# Patient Record
Sex: Female | Born: 1965 | Race: Black or African American | Hispanic: No | Smoking: Never smoker
Health system: Southern US, Community
[De-identification: ages and names within clinical notes are randomized; demographics above are authoritative.]

## PROBLEM LIST (undated history)

## (undated) DIAGNOSIS — M810 Age-related osteoporosis without current pathological fracture: Secondary | ICD-10-CM

## (undated) DIAGNOSIS — I10 Essential (primary) hypertension: Secondary | ICD-10-CM

## (undated) HISTORY — PX: OOPHORECTOMY: SHX86

## (undated) HISTORY — DX: Essential (primary) hypertension: I10

## (undated) HISTORY — DX: Morbid (severe) obesity due to excess calories: E66.01

## (undated) HISTORY — DX: Age-related osteoporosis without current pathological fracture: M81.0

---

## 1999-10-04 ENCOUNTER — Ambulatory Visit (HOSPITAL_COMMUNITY): Admission: RE | Admit: 1999-10-04 | Discharge: 1999-10-04 | Payer: Self-pay | Admitting: *Deleted

## 1999-10-04 ENCOUNTER — Encounter: Payer: Self-pay | Admitting: *Deleted

## 1999-10-11 ENCOUNTER — Encounter: Payer: Self-pay | Admitting: *Deleted

## 1999-10-11 ENCOUNTER — Ambulatory Visit (HOSPITAL_COMMUNITY): Admission: RE | Admit: 1999-10-11 | Discharge: 1999-10-11 | Payer: Self-pay | Admitting: *Deleted

## 2001-08-03 ENCOUNTER — Encounter: Payer: Self-pay | Admitting: Obstetrics and Gynecology

## 2001-08-03 ENCOUNTER — Ambulatory Visit (HOSPITAL_COMMUNITY): Admission: RE | Admit: 2001-08-03 | Discharge: 2001-08-03 | Payer: Self-pay | Admitting: Obstetrics and Gynecology

## 2001-08-18 ENCOUNTER — Ambulatory Visit (HOSPITAL_COMMUNITY): Admission: RE | Admit: 2001-08-18 | Discharge: 2001-08-18 | Payer: Self-pay | Admitting: Obstetrics and Gynecology

## 2001-08-18 ENCOUNTER — Encounter: Payer: Self-pay | Admitting: Obstetrics and Gynecology

## 2001-09-15 ENCOUNTER — Ambulatory Visit (HOSPITAL_COMMUNITY): Admission: RE | Admit: 2001-09-15 | Discharge: 2001-09-15 | Payer: Self-pay | Admitting: Obstetrics and Gynecology

## 2001-09-15 ENCOUNTER — Encounter: Payer: Self-pay | Admitting: Obstetrics and Gynecology

## 2002-01-11 ENCOUNTER — Inpatient Hospital Stay (HOSPITAL_COMMUNITY): Admission: AD | Admit: 2002-01-11 | Discharge: 2002-01-14 | Payer: Self-pay | Admitting: Obstetrics and Gynecology

## 2002-01-15 ENCOUNTER — Encounter: Admission: RE | Admit: 2002-01-15 | Discharge: 2002-02-14 | Payer: Self-pay | Admitting: Obstetrics and Gynecology

## 2002-02-15 ENCOUNTER — Encounter: Admission: RE | Admit: 2002-02-15 | Discharge: 2002-03-17 | Payer: Self-pay | Admitting: Obstetrics and Gynecology

## 2002-09-20 ENCOUNTER — Other Ambulatory Visit: Admission: RE | Admit: 2002-09-20 | Discharge: 2002-09-20 | Payer: Self-pay | Admitting: Obstetrics and Gynecology

## 2003-11-15 ENCOUNTER — Other Ambulatory Visit: Admission: RE | Admit: 2003-11-15 | Discharge: 2003-11-15 | Payer: Self-pay | Admitting: Obstetrics and Gynecology

## 2007-09-28 ENCOUNTER — Encounter: Admission: RE | Admit: 2007-09-28 | Discharge: 2007-09-28 | Payer: Self-pay | Admitting: Obstetrics and Gynecology

## 2007-11-08 ENCOUNTER — Encounter (INDEPENDENT_AMBULATORY_CARE_PROVIDER_SITE_OTHER): Payer: Self-pay | Admitting: Obstetrics and Gynecology

## 2007-11-08 ENCOUNTER — Ambulatory Visit (HOSPITAL_COMMUNITY): Admission: RE | Admit: 2007-11-08 | Discharge: 2007-11-08 | Payer: Self-pay | Admitting: Obstetrics and Gynecology

## 2009-08-09 ENCOUNTER — Emergency Department (HOSPITAL_COMMUNITY): Admission: EM | Admit: 2009-08-09 | Discharge: 2009-08-09 | Payer: Self-pay | Admitting: Emergency Medicine

## 2010-07-11 IMAGING — CT CT ABD-PELV W/ CM
2 of 6 series · 17 of 46 positions shown, 19 images · IV contrast (agent unspecified)
Comparison: None

CLINICAL DATA: Right lower quadrant abdominal pain.

CT ABDOMEN AND PELVIS WITH CONTRAST
TECHNIQUE: Multidetector CT imaging of the abdomen and pelvis was
performed following the standard protocol during bolus
administration of intravenous contrast.
Contrast: 100 ml Vmnipaque-3EE

[Series 4: recon 3: routine abdomen · axial · 0.74mm/px · z∈[-474,-86]mm · 14 of 342 slices shown, 16 images]
[im 16/342  soft-tissue]
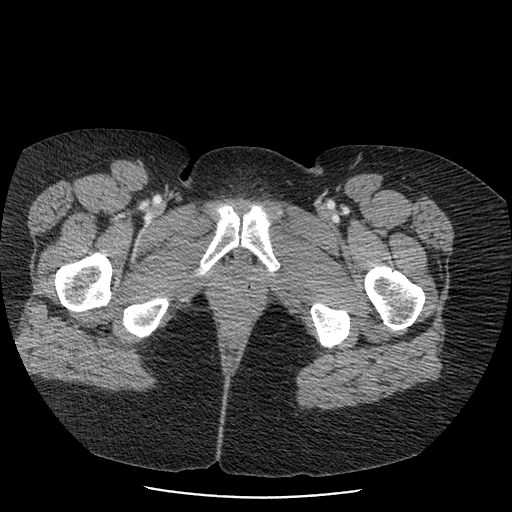
[im 16/342  bone]
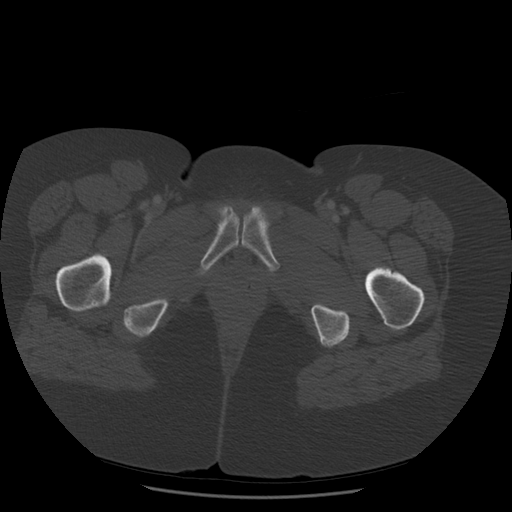
[im 47/342  soft-tissue]
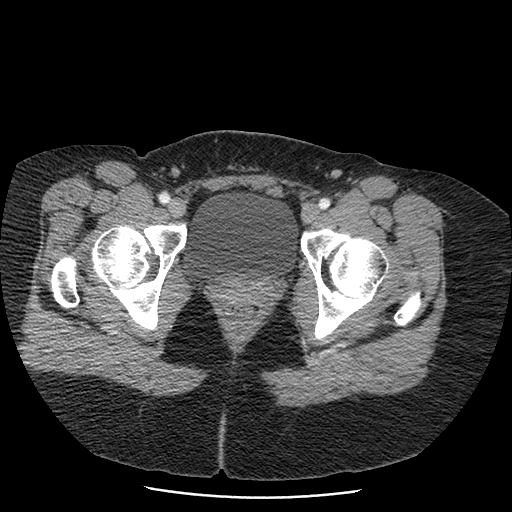
[im 63/342  soft-tissue]
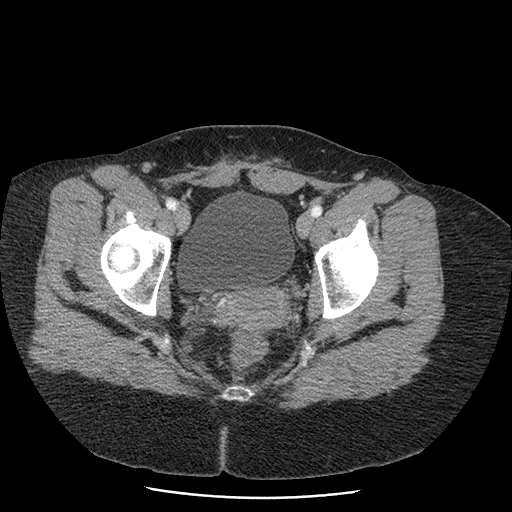
[im 94/342  soft-tissue]
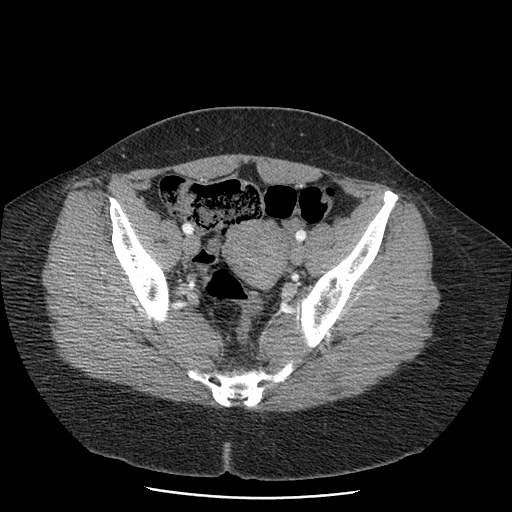
[im 109/342  soft-tissue]
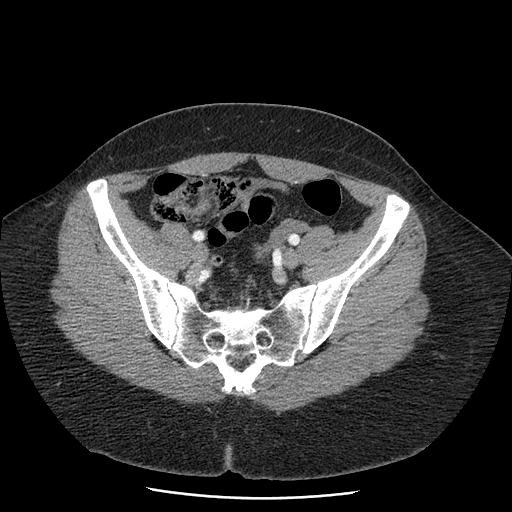
[im 140/342  soft-tissue]
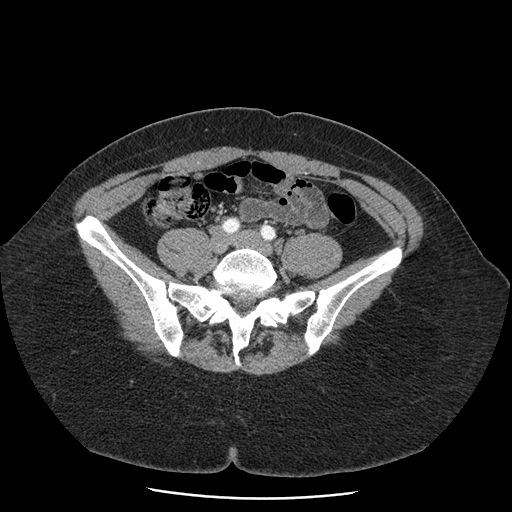
[im 156/342  soft-tissue]
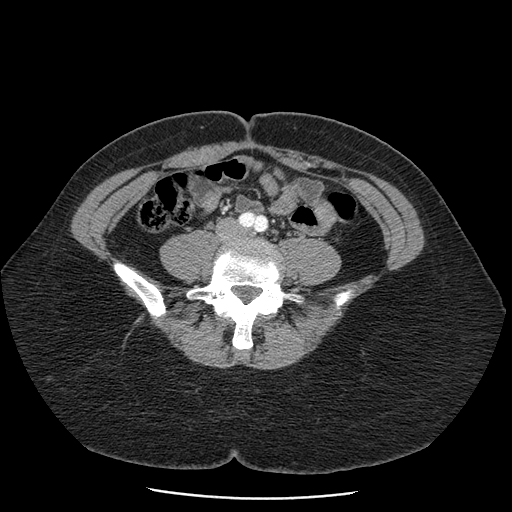
[im 187/342  soft-tissue]
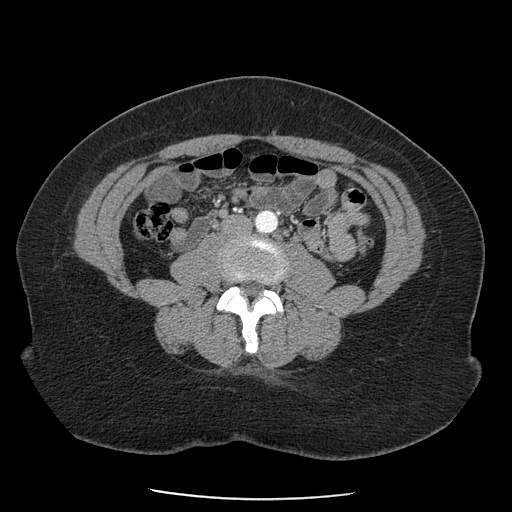
[im 202/342  soft-tissue]
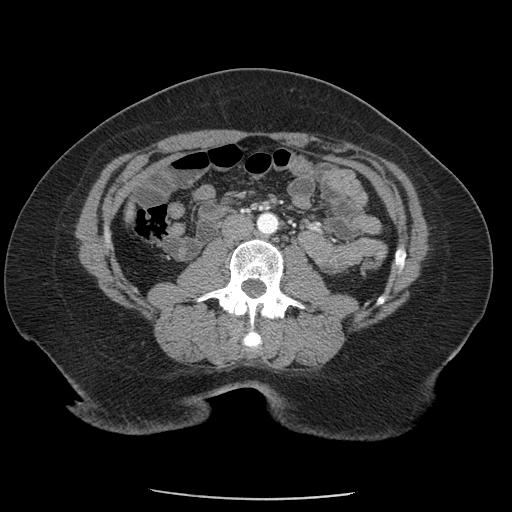
[im 202/342  bone]
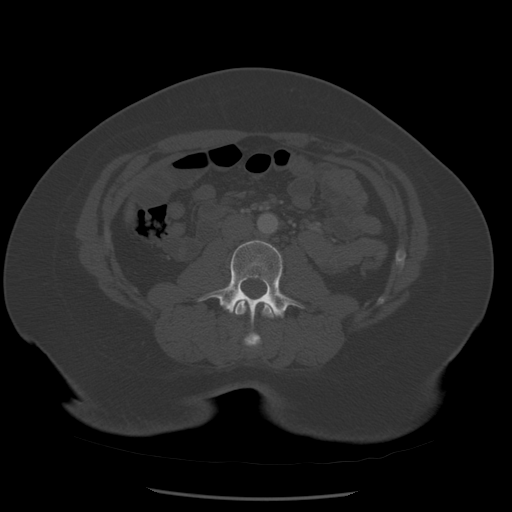
[im 233/342  soft-tissue]
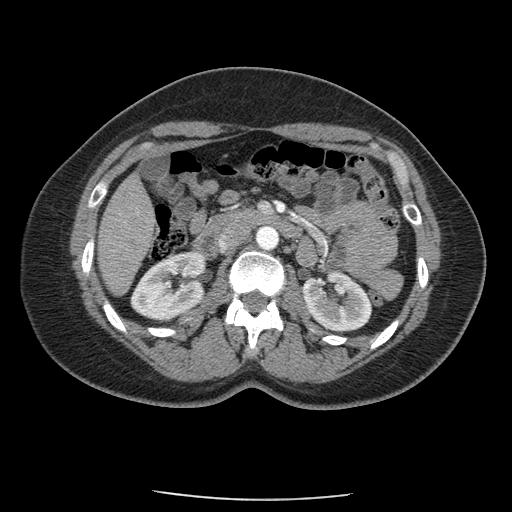
[im 249/342  soft-tissue]
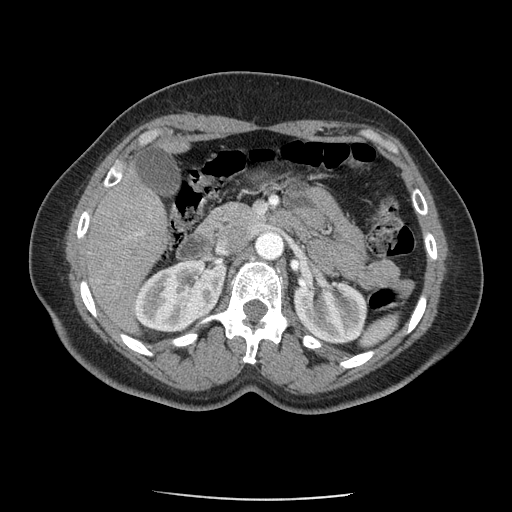
[im 280/342  soft-tissue]
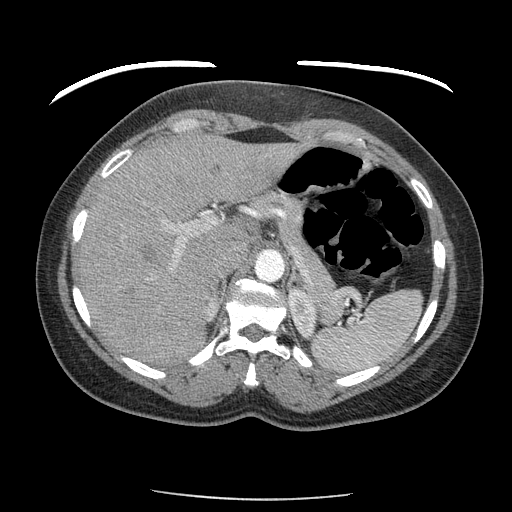
[im 295/342  soft-tissue]
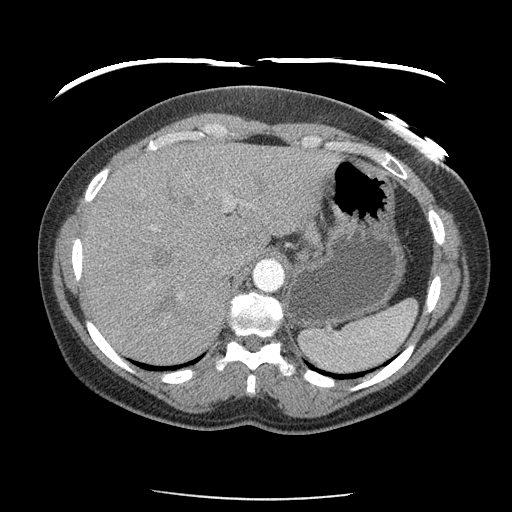
[im 326/342  soft-tissue]
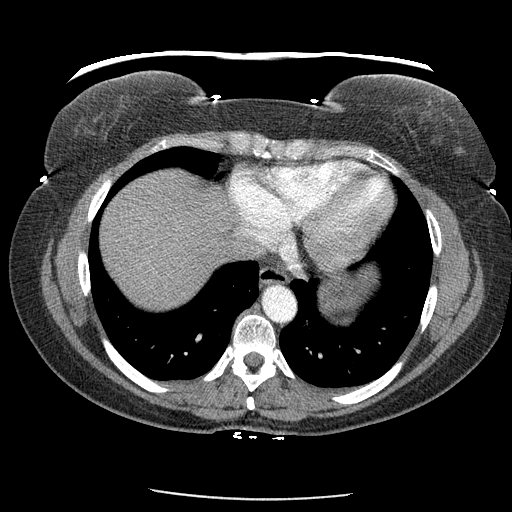

[Series 401: cor · coronal · 0.88mm/px · 3 of 104 slices shown]
[im 35/104  soft-tissue]
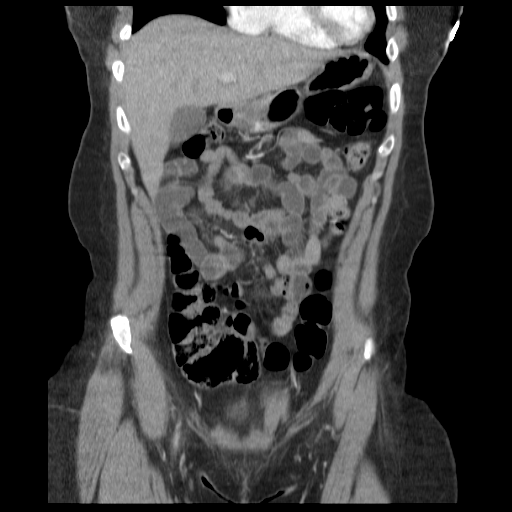
[im 46/104  soft-tissue]
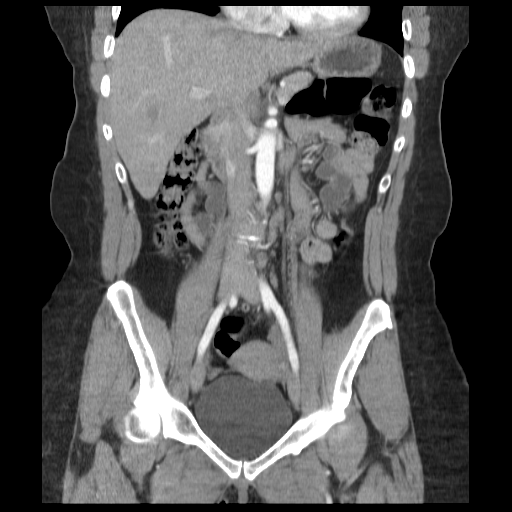
[im 58/104  soft-tissue]
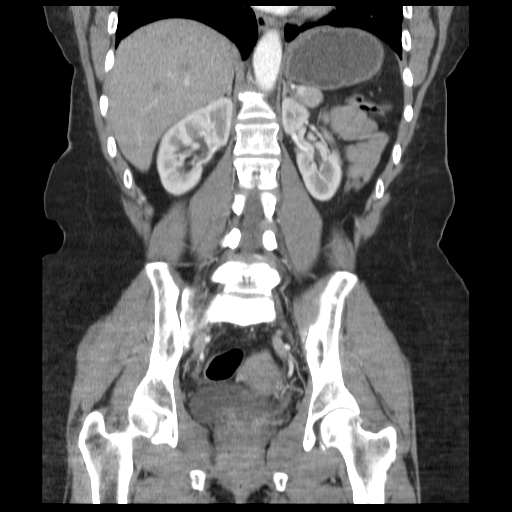

[17 of 46 positions shown; findings below may reference images not displayed]

FINDINGS: The liver, spleen, pancreas, and adrenal glands appear
unremarkable.

The gallbladder and biliary system appear unremarkable.

The kidneys appear unremarkable, as do the proximal ureters.

Small retroperitoneal lymph nodes may be reactive.

No dilated bowel to suggest obstruction.  The appendix appears
normal.

The uterus and adnexa appear unremarkable.  No significant abnormal
free pelvic fluid.

Urinary bladder appears normal.  A specific cause for the patient's
right lower quadrant abdominal pain is not identified.
IMPRESSION: 1.  Normal appendix.  No significant abnormal acute findings are
identified.

## 2010-09-26 LAB — URINALYSIS, ROUTINE W REFLEX MICROSCOPIC
Bilirubin Urine: NEGATIVE
Glucose, UA: NEGATIVE mg/dL
Hgb urine dipstick: NEGATIVE
Ketones, ur: NEGATIVE mg/dL
Nitrite: NEGATIVE
Protein, ur: NEGATIVE mg/dL
Specific Gravity, Urine: 1.008 (ref 1.005–1.030)
Urobilinogen, UA: 0.2 mg/dL (ref 0.0–1.0)
pH: 7.5 (ref 5.0–8.0)

## 2010-09-26 LAB — DIFFERENTIAL
Basophils Absolute: 0 10*3/uL (ref 0.0–0.1)
Basophils Relative: 1 % (ref 0–1)
Eosinophils Absolute: 0 10*3/uL (ref 0.0–0.7)
Eosinophils Relative: 1 % (ref 0–5)
Lymphocytes Relative: 49 % — ABNORMAL HIGH (ref 12–46)
Lymphs Abs: 2.1 10*3/uL (ref 0.7–4.0)
Monocytes Absolute: 0.2 10*3/uL (ref 0.1–1.0)
Monocytes Relative: 6 % (ref 3–12)
Neutro Abs: 1.9 10*3/uL (ref 1.7–7.7)
Neutrophils Relative %: 44 % (ref 43–77)

## 2010-09-26 LAB — COMPREHENSIVE METABOLIC PANEL
ALT: 16 U/L (ref 0–35)
AST: 22 U/L (ref 0–37)
Albumin: 3.8 g/dL (ref 3.5–5.2)
Alkaline Phosphatase: 57 U/L (ref 39–117)
BUN: 7 mg/dL (ref 6–23)
CO2: 29 mEq/L (ref 19–32)
Calcium: 9.1 mg/dL (ref 8.4–10.5)
Chloride: 105 mEq/L (ref 96–112)
Creatinine, Ser: 0.77 mg/dL (ref 0.4–1.2)
GFR calc Af Amer: >60 mL/min (ref >60)
GFR calc non Af Amer: >60 mL/min (ref >60)
Glucose, Bld: 96 mg/dL (ref 70–99)
Potassium: 3.8 mEq/L (ref 3.5–5.1)
Sodium: 141 mEq/L (ref 135–145)
Total Bilirubin: 0.4 mg/dL (ref 0.3–1.2)
Total Protein: 7 g/dL (ref 6.0–8.3)

## 2010-09-26 LAB — CBC
HCT: 34.7 % — ABNORMAL LOW (ref 36.0–46.0)
Hemoglobin: 12.1 g/dL (ref 12.0–15.0)
MCHC: 34.9 g/dL (ref 30.0–36.0)
MCV: 92.6 fL (ref 78.0–100.0)
Platelets: 219 10*3/uL (ref 150–400)
RBC: 3.74 MIL/uL — ABNORMAL LOW (ref 3.87–5.11)
RDW: 12.9 % (ref 11.5–15.5)
WBC: 4.2 10*3/uL (ref 4.0–10.5)

## 2010-11-19 NOTE — H&P (Signed)
NAMEMASHELLE, [name]          ACCOUNT NO.:  [id number]   MEDICAL RECORD NO.:  [id number]          PATIENT TYPE:  AMB   LOCATION:  SDC                           FACILITY:  [facility]   PHYSICIAN:  Misty A. Dillard, M.D. DATE OF BIRTH:  01-05-1966   DATE OF ADMISSION:  DATE OF DISCHARGE:                              HISTORY & PHYSICAL   CHIEF COMPLAINT:  Menorrhagia and right ovarian mass.   The patient is a 45 year old African-American female gravida 3, para 1  whose last menstrual period was October 11, 2007 who presented to me  complaining of heavy vaginal bleeding with menses every 24-28 days for  last couple years.  She soaks a pad and a tampon every 2-3 hours and  bleeds heavy for 5 days.  Denies any bleeding disorders, chest pain or  shortness of breath.  The patient denies having a history of fibroids.  She is not on hormone therapy or contraception.  She is not on any  medications.  Denies any menopausal symptoms, vaginal discharge,  abdominal pain or increased stress.   PAST MEDICAL HISTORY:  As above.   PAST SURGICAL HISTORY:  1. Significant for laparoscopy x1.  2. Cesarean section x1.   PAST GYNECOLOGIC HISTORY:  As above with history of HSV-2.   ALLERGIES:  No known drug allergies.   SOCIAL HISTORY:  Negative for alcohol, tobacco or drug use.   PAST OBSTETRICAL HISTORY:  Significant for a C-section x1, an elective  abortion x1 and miscarriage x1.   REVIEW OF SYSTEMS:  CARDIOVASCULAR:  There are no heart palpitations.  RESPIRATORY:  There is no asthma.  ENDOCRINE:  There is no thyroid disease.  NEUROLOGIC:  No weakness or seizures.  GENITOURINARY:  Significant for ovarian cyst and menorrhagia.   PHYSICAL EXAMINATION:  VITAL SIGNS:  Blood pressure is 130/90.  She  weighs 201 pounds.  HEENT:  Pupils are equal.  Hearing is normal.  Throat is clear.  Thyroid  is not enlarged.  HEART:  Regular rate and rhythm.  LUNGS:  Clear to auscultation bilaterally.  BREASTS:  No  masses, discharge, skin change or nipple retraction.  BACK:  No CVA tenderness bilaterally.  ABDOMEN:  Nontender without any masses or organomegaly.  EXTREMITIES:  No cyanosis, clubbing or edema.  NEUROLOGIC:  Within normal limits.  PELVIC:  Full vaginal exam is within normal limits.  Cervix is nontender  without any lesions.  Uterus is normal shape, size and consistency.  Adnexa had no masses.  The uterus did sound of 10 cm.   The patient had an endometrial biopsy which showed benign proliferative  endometrium.  CA-125 was 19.3.  The right ovarian cyst was found  incidentally when looking for her reason for menorrhagia and measures 10  x 7.4 x 8.8 cm.  __________ and low level echoes throughout.  Minimal  vascularity is noted.  No uterine masses are noted.  The uterus measures  11.1 x 5 x 5.9.  The patient had a normal mammogram.  She is anemic with  hemoglobin of 10.3.  TSH was normal.  HIV nonreactive.  Pap smear was  negative for  intraepithelial lesions or malignancy.   ASSESSMENT:  Menorrhagia with a right ovarian mass.   PLAN:  Laparoscopy with right oophorectomy.  The patient understands  that there is a small chance that this could be cancerous.  She was  offered a GYN consultation but decided that she does want to proceed  with the surgery.  She understands that if ovarian cancer is present,  she might need a second surgery, even a hysterectomy.  The patient  agrees with plan.  All treatments for menorrhagia were reviewed with the  patient from observation to iron to NSAIDs to hormonal treatments,  ablation, hysterectomy.  The patient has chosen to go with the  ThermaChoice ablation.  Risks and benefits were reviewed.  The patient  was given a bowel prep because of her prior history of surgery.  Risks  and benefits were reviewed.      Misty [name], M.D.  Electronically Signed     NAD/MEDQ  D:  11/07/2007  T:  11/07/2007  Job:  [id number]

## 2010-11-19 NOTE — Op Note (Signed)
Misty [name], Misty [name]          ACCOUNT NO.:  [id number]   MEDICAL RECORD NO.:  [id number]          PATIENT TYPE:  AMB   LOCATION:  SDC                           FACILITY:  [facility]   PHYSICIAN:  Naima A. Dillard, M.D. DATE OF BIRTH:  [birth year]   DATE OF PROCEDURE:  11/08/2007  DATE OF DISCHARGE:                               OPERATIVE REPORT   PREOPERATIVE DIAGNOSES:  1. Right ovarian mass.  2. Menorrhagia.   POSTOPERATIVE DIAGNOSES:  1. Bilateral ovarian cysts.  2. Menorrhagia.   PROCEDURES:  1. Dilation and curettage and hysteroscopy.  2. ThermaChoice ablation.  3. Operative laparoscopy.  4. Right salpingo-oophorectomy.  5. Left ovarian cystectomy.  6. Lysis of adhesions.   SURGEON:  Naima A. Normand Sloop, MD.   ASSISTANT:  Dr. Su Hilt.   ANESTHESIA:  General.   FINDINGS:  Two large 10-cm ovarian cysts, pelvic and abdominal  adhesions.   SPECIMENS:  Right ovary tube and the left ovarian cyst wall.   DISPOSITION:  All specimens were sent to pathology.   ESTIMATED BLOOD LOSS:  Minimal.   URINE OUTPUT:  150 mL.   CRYSTALLOID:  1900 mL IV fluids.   COMPLICATIONS:  None.   The patient returned to recovery in stable condition.   PROCEDURE IN DETAIL:  The patient was taken to the operating room where  she was given general anesthesia, placed in dorsal lithotomy position  and prepped and draped in a normal sterile fashion.  A Foley catheter  was placed into the urethra and a bivalve speculum was placed into the  vagina.  The anterior lip of the cervix was grasped with a single-tooth  tenaculum.  The uterus did sound to 11 cm.  The cervix was further  dilated with [facility] dilators.  The hysteroscope was placed into the  uterine cavity.  There were no submucosal fibroids or polyps,  just  abundant fluffy endometrium seen all the way to  the fundus, all side  walls were noted.  The ThermaChoice ablation was done per protocol.  A  second look was done and it looked that 95%  to 100% of the fundal cavity  was ablated.  There was a small pink area in the anterior wall of the  fundus.  The hysteroscopic deficit was 50 mL.  All instruments were  removed from the vagina, then acorn manipulator was placed to ablate the  uterus.  Attention was then turned to the umbilicus where 5 mL of 0.25%  Marcaine was used around the umbilicus.  Two Allis were placed on each  end of the previous incision, infraumbilical incision and the scalpel  was used to go through the same incision.  This was carried down to the  fascia.  The fascia was incised in the midline, the peritoneum was  identified, tented up and entered sharply.  The fascia was then  circumscribed with 0 Vicryl in a circumferential manner.  A Hasson  trocar was then placed into abdominal cavity and anchored to the fascial  stitch.  Intra-abdominal placement was confirmed with laparoscope.  The  abdomen was insufflated with CO2 gas.  The findings are as  follows.  The  patient had about 11-week size uterus, some small, tiny fibroids on the  posterior part of the uterus.  The anterior part of the uterus was  normal.  The patient had a right complex about 10 cm ovary.  The tube  appeared normal.  The appendix was adherent to the right complex mass  along with some mild bowel adhesions.  The patient also had some  anterior wall abdominal adhesions consistent with Lynnae January.  The  patient had a large simple-appearing left ovarian cyst.  There was also  10 cm normal left tube.  The rest of the anatomy was normal.  Two right  and a left lower quadrant trocar were placed under direct visualization  to avoid the inferior epigastric vessels.  Under direct visualization  with a laparoscope after Marcaine was placed, the patient's right  infundibulopelvic ligament was identified.  The ureter was then  identified.  Once the ureter was identified, the right infundibulopelvic  ligament was cauterized with the gyrus and cut.   The patient's utero-  ovarian ligament was cauterized and cut and the ovary was removed and  placed in the cul-de-sac.  Before any of this could occur, we had the  lysis of adhesions and cut both sharply and cut the adhesions from the  appendix to the right ovarian mass.  Hemostasis was noted.  The appendix  was normal.  No bowel injury was noted.  Attention was then turned to  the left ovary where 60 mL straw-colored clear fluid was evacuated from  the cyst wall.  Majority of the cyst was removed with the scissors, then  the cyst wall was removed from the remaining ovary.  Any bleeding areas  were made hemostatic with cautery.  A 10-mm incision was made along her  previous suprapubic incision from her C-section scar and a 10-mm port  was placed without difficulty.  The bag was placed into the abdominal  cavity.  The large ovarian cyst was placed into the bag and then drained  of 240 mL of straw-colored fluid that was sent to pathology.  The cyst  wall was also placed in the bag from the left ovary.  All contents were  then removed out of the 10-mm suprapubic port.  We did have to extend  the fascia slightly to remove the entire ovary when it was drained.  We  allowed the gas to leave the abdomen but we reinflated.  Hemostasis was  assured.  Irrigation was done.  We looked at the right ureter.  Again,  it was peristalsing without any evidence of injury.  All instruments  were removed under visualization of the laparoscope.  The 10-mm  infraumbilical port fascia was tied together and the skin was closed  with 3-0 Monocryl.  The 10-mm suprapubic port a figure-of-eight 0 Vicryl  stitch was placed in the fascia and the skin was closed with 3-0  Monocryl in subcuticular fashion.  The other incisions were closed with  Dermabond.  Dermabond was applied to actually all incisions.  The  tenaculum and acorn manipulator with Foley was removed.  I did look  again with the speculum, no bleeding was  noted.  Sponge, lap and needle  counts were correct.  The patient returned to recovery room in stable  condition.      Naima A. Normand Sloop, M.D.  Electronically Signed     NAD/MEDQ  D:  11/08/2007  T:  11/09/2007  Job:  [id number]

## 2010-11-22 NOTE — H&P (Signed)
[facility]  Patient:    Misty [name], Misty [name] Visit Number: [id number] MRN: [id number]          Service Type: Attending:  Naima A. Normand Sloop, M.D. Dictated by:   Pierre Bali. Normand Sloop, M.D. Adm. Date:  08/02/01                           History and Physical  DATE OF BIRTH:                Aug 02, 1965  HISTORY OF PRESENT ILLNESS:   The patient is a 45 year old gravida 3 para 0, 0-0-2-0, whose last menstrual period was April 20, 2001, and whose due date is January 17, 2002 by ten week ultrasound.  The patient will be 16 weeks tomorrow.  The patient was seen in the office on July 14, 2001 and was undecided whether she wanted an amniocentesis secondary to advanced maternal age.  The patient was then called on July 16, 2001 at home and stated that she wanted to have amniocentesis.  The patients blood type is A-positive. Antibody negative.  The patient understands that her risk of Down syndrome is about 1:185 and her risk of other abnormalities is about 1:106 chromosomal abnormalities.  The patient stated that she wanted to have an amniocentesis. She understands the risks are, but not limited to, bleeding, infection, and a fetal pregnancy loss of about 1:200-1:300 persons.  The patient still agrees with the procedure. Dictated by:   Pierre Bali. Normand Sloop, M.D. Attending:  Naima A. Dillard, M.D. DD:  08/02/01 TD:  08/02/01 Job: [id number]

## 2010-11-22 NOTE — Discharge Summary (Signed)
[facility]  Patient:    Misty [name], Misty [name] Visit Number: [id number] MRN: [id number]          Service Type: OBS Location: 910A [id number] [id number] Attending Physician:  Misty [name] A Dictated by:   Misty [name], C.N.M. Admit Date:  01/11/2002 Discharge Date: 01/14/2002                             Discharge Summary  ADMISSION DIAGNOSES:          1. Intrauterine pregnancy at term.                               2. Infant with emphalocele.                               3. Desires cesarean section for delivery.  DISCHARGE DIAGNOSES:          1. Intrauterine pregnancy at term.                               2. Infant with emphalocele.                               3. Desires cesarean section for delivery.                               4. Status post low transverse cesarean section.                               5. Breastfeeding.                               6. Baby in neonatal intensive care unit status                                  post surgery.  PROCEDURE:                    Primary low transverse cesarean section for delivery of a viable female infant who weighed 7 pounds 13 ounces and had Apgars of 8 and 9 on January 11, 2002, attended by Misty [name], M.D. and Misty [name], C.N.M.  The infant had an approximately 4 cm emphalocele.  HOSPITAL COURSE:              Misty [name] is a 45 year old married black female, gravida 3, para 0-0-2-0, at term, who presented for cesarean section delivery secondary to infant with emphalocele.  She underwent the same without complications and delivered a female infant who had Apgars of 8 and 9 and weighed 7 pounds 13 ounces.  The infant was taken to the NICU and operated on the next day for 4 cm emphalocele and has been doing well.  The patient has been doing well postoperatively.  She is ambulating, voiding, and eating without difficulty.  She is afebrile, her vital signs have remained stable. She is eating a  regular diet and ambulating and voiding.  She is breastfeeding and breast  pumping and had no difficulty with that either.  Her husband has had a vasectomy for contraception.  She is deemed ready for discharge today.  DISCHARGE INSTRUCTIONS:       As per the [facility] and Gynecology handout.  DISCHARGE MEDICATIONS:        1. Motrin 600 mg p.o. q.6h. p.r.n. for pain.                               2. Tylox one to two p.o. q.4-6h. p.r.n. for                                  pain.  DISCHARGE LABORATORY DATA:    Hemoglobin 9.0, WBC 8.7, platelets 216.  FOLLOW-UP:                    In six weeks at [facility] Gynecology or p.r.n. Dictated by:   Misty [name], C.N.M. Attending Physician:  Misty [name] DD:  01/14/02 TD:  01/17/02 Job: [id number]

## 2010-11-22 NOTE — Op Note (Signed)
[facility]  Patient:    Misty [name], Misty [name] Visit Number: [id number] MRN: [id number]          Service Type: Attending:  Naima A. Normand Sloop, M.D. Dictated by:   Pierre Bali. Normand Sloop, M.D. Proc. Date: 08/03/01 Adm. Date:  08/03/01                             Operative Report  OFFICE:                       #[id number] DATE OF BIRTH:                [birth year]  INDICATIONS:                  The patient is a 45 year old G 3, P 0, 0, 2, 0, who is at 16 weeks, who came in for an amniocentesis.  The risks of bleeding, infection, and a 1/200 to 1/300 pregnancy loss was explained to the patient in detail.  The patient still consented to the procedure.  PHYSICIAN:                    Naima A. Dillard, M.D.  DESCRIPTION OF PROCEDURE:     The abdomen was prepped and draped.  About 5 cc of 1% lidocaine was placed in the lower left quadrant of the abdomen.  Under direct visualization 20 cc of clear amniotic fluid was obtained during the amniocentesis.  The patient tolerated the procedure well.  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  The patient was told what a variceal is, and the percentage rate of chromosomal disorders. The patients entire anatomy could not be visualized today, so she is rescheduled for a follow-up anatomy scan in two weeks, with an echocardiogram. We will also give her a consultation with a pediatric surgeon, Dr. Donnella Bi D. Pendse. Dictated by:   Pierre Bali. Normand Sloop, M.D. Attending:  Naima A. Dillard, M.D. DD:  08/03/01 TD:  08/03/01 Job: [id number]

## 2010-11-22 NOTE — H&P (Signed)
[facility]  Patient:    Misty [name], Misty [name] Visit Number: [id number] MRN: [id number]          Service Type: Attending:  Naima A. Normand Sloop, M.D. Dictated by:   Pierre Bali. Normand Sloop, M.D. Adm. Date:  01/11/02                           History and Physical  OFFICE NUMBER:                [id number]  DATE OF BIRTH:                1966/02/27  HISTORY OF PRESENT ILLNESS:   The patient is a 45 year old African-American female, G3, P0-0-2-0, whose last menstrual period was April 20, 2001, estimated date of confinement of January 17, 2002, by a 10 week ultrasound.  The patient is presenting for a primary low transverse cesarean section secondary to omphalocele which was found on her early ultrasound during her amniocentesis for advanced maternal age.  The patient was told that from retrospective studies, there is usually no difference in morbidity or mortality of an infant of a vaginal delivery versus a cesarean section and that vaginal delivery did have the less risk involved than cesarean section. However, as the patient did want to have a controlled delivery with a C-section versus a controlled delivery with a planned induction, we could arrange for either, and the patient has chosen to proceed with cesarean section.  The patient received prenatal care at [facility] since 13 weeks of pregnancy.  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  She did have an amniocentesis which showed normal female, 46,XY and echocardiogram at 18 weeks which was found to be normal.  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  PAST OBSTETRIC HISTORY:       1. In 1985, she had elective abortion in the                                  first trimester without any complications.  2. In April 2001, she had a miscarriage in her                                  first trimester without any complications.  PAST MEDICAL HISTORY:         HSV 2 as above.  PAST GYNECOLOGIC HISTORY:     1. She started menarche at age 55.  Her menses                                  come every 28 days and last for 5 days.                               2. She has had a history of PID that was treated  in 1986.                               3. She has a history of HSV 2.  Her last                                  outbreak was in 2002.                               4. Also has a history of occasional candidiasis.                               5. She did take birth control pills and used                                  condoms but stopped in March 2001.  PAST SURGICAL HISTORY:        Diagnostic laparoscopy in 1991 in which she had an ovarian cyst removed.  FAMILY HISTORY:               Significant for father, who died of a myocardial infarction, a sister who has chronic hypertension, an older sister who has non-insulin dependent diabetes and thyroid dysfunction.  She has a maternal aunt with lupus.  GENETIC HISTORY:              The father of the baby was born with an extra finger, and she has a nephew with Downs syndrome and a maternal aunt with mental retardation.  SOCIAL HISTORY:               She does not drink alcohol, smoke cigarettes, or have any illicit drug use.  She is African-American, and she is married to Darnelle Bos, who is involved and very supportive of this pregnancy and understands all that is involved.  REVIEW OF SYSTEMS:            Is as above.  The patient is pregnant and, as above history, otherwise unremarkable.  PHYSICAL EXAMINATION:  VITAL SIGNS:                  Blood pressure 110/70, weight 199 pounds.  She has good fetal movement.  GENERAL:                      She is in no apparent distress.  NECK:                          Her thyroid has no masses and no thyromegaly.  HEART:                        Regular rate and rhythm.  LUNGS:                        Clear to auscultation bilaterally.  ABDOMEN:                      Gravid, soft and nontender.  VULVOVAGINAL:                 Within  normal limits.  Cervix is found to be 1-2 cm dilated, 50% -2 with a vertex.  EXTREMITIES:                  Trace edema with no cyanosis or clubbing.  ASSESSMENT:                   Pregnancy at term.  PLAN:                         Proceed with a primary low transverse cesarean section.  The risks of bleeding, infection, damage to internal organs such as bowel and bladder.  Again, the patient was given the option of a cesarean section versus a planned induction for vaginal delivery and again, studies were reviewed with the patient stating that mode of delivery are both safe and do not affect the morbidity or mortality of the child and that a C-section can have more complications; however, the patient has chosen to have a C-section. She understands that this may also affect her next pregnancies, meaning that there would be less than 1% of rupture of her uterus if she decided to have vaginal birth after cesarean or she could have another planned C-section.  The patient voiced a proper understanding and has decided to proceed with a C-section.  Dr. Levie Heritage, who is a pediatric surgeon, was notified and also did not mind the mode of delivery and will be available if needed.  The plan is for the patient to undergo a cesarean section on January 11, 2002, at 12:30 p.m. Dictated by:   Pierre Bali. Normand Sloop, M.D. Attending:  Naima A. Dillard, M.D. DD:  01/10/02 TD:  01/10/02 Job: [id number]

## 2010-11-22 NOTE — Op Note (Signed)
[facility]  Patient:    Misty [name], Misty [name] Visit Number: [id number] MRN: [id number]          Service Type: OBS Location: [facility] Attending Physician:  Leonard Schwartz Dictated by:   Leona Carry, M.D. Proc. Date: 01/11/02                             Operative Report  PREOPERATIVE DIAGNOSIS:       Pregnancy at term with a lymphallocele.  Desires cesarean section.  POSTOPERATIVE DIAGNOSIS:      Pregnancy at term with a lymphallocele.  Desires cesarean section.  OPERATION:                    Primary low transverse cesarean section.  SURGEON:                      Leona Carry, M.D.  ASSISTANTVance Gather Duplantis, C.N.M.  ANESTHESIA:                   Spinal.  IV FLUIDS:                    3000 cc crystalloid.  ESTIMATED BLOOD LOSS:         700 cc.  URINE OUTPUT:                 50 cc clear urine.  FINDINGS:                     A female infant in vertex presentation with Apgars of 8 and 9 and cord pH of 7.16.  There was clear amniotic fluid.  No meconium, no nuchal cord.  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  COMPLICATIONS:                None. The patient went to the recovery room in stable condition.  DESCRIPTION OF PROCEDURE:     The patient was taken to the operating room, given spinal anesthesia, and placed in the dorsal supine position with a left lateral tilt.  The patient was prepped and draped in the usual sterile fashion and a Foley catheter was placed.  The patient was then checked to make sure that her anesthesia was adequate with Allis along the length of the incision and the abdomen and was found to have her anesthesia to be adequate.  A Pfannenstiel skin incision was then made along the abdomen 2 cm above the symphysis pubis and carried down with the Bovie to the fascia.  The fascia was then incised in the midline and extended  bilaterally using Mayo scissors and pickups with teeth.  Kochers x2 were placed on the superior aspect of the fascia which was elevated off the rectus muscles both sharply and bluntly. The Kochers x2 were placed on the inferior aspect of the fascia which was dissected off the rectus muscle both sharply and bluntly.  The rectus muscle was then separated in the midline.  The peritoneum was identified, tented up, and entered sharply with Metzenbaum scissors.  The peritoneal incision was extended with good visualization of bowel and bladder.  Bladder blade was inserted.  The vesicouterine peritoneum was identified, tented up, and entered sharply with  Metzenbaum scissors and extended bilaterally.  The bladder flap was then created both sharply and digitally.  Bladder blade was then reinserted. A primary lower transverse uterine incision was then made with the scalpel and extended bilaterally bluntly.  There was noted to be clear fluid, no meconium.  The infants head was not able to be delivered due to the angle. The infants head was then angled correctly and was still unable to be delivered, so a Kiwi vacuum was placed correctly and there were three pulls, two popoffs, each pull was in the green zone and on the third pull, the head was delivered without difficulty.  There was no nuchal cord.  The body was delivered.  The cord was clamped and cut and handed over to the awaiting pediatricians.  The placenta was manually delivered and sent to pathology for evaluation.  The uterus was cleared of all clot and debris.  The uterine incision was then repaired with 0 Vicryl in a running locked fashion. Hemostasis was assured.  The patients ovaries and tubes were then visualized and noted to be normal.  The uterus was also noted to be normal.  The abdomen was irrigated with saline.  Hemostasis was again noted.  The fascia was closed with 0 Vicryl.  The subcutaneous tissue was then irrigated and made  hemostatic with Bovie.  The skin was closed with staples.  Sponge, needle, and instrument counts were correct x2.  The patient went to the recovery room in stable condition. Dictated by:   Leona Carry, M.D. Attending Physician:  Leonard Schwartz DD:  01/11/02 TD:  01/14/02 Job: [id number]

## 2011-06-28 ENCOUNTER — Ambulatory Visit (INDEPENDENT_AMBULATORY_CARE_PROVIDER_SITE_OTHER): Payer: 59

## 2011-06-28 DIAGNOSIS — M545 Low back pain, unspecified: Secondary | ICD-10-CM

## 2011-06-28 DIAGNOSIS — N898 Other specified noninflammatory disorders of vagina: Secondary | ICD-10-CM

## 2011-06-28 DIAGNOSIS — N760 Acute vaginitis: Secondary | ICD-10-CM

## 2011-07-01 ENCOUNTER — Ambulatory Visit (INDEPENDENT_AMBULATORY_CARE_PROVIDER_SITE_OTHER): Payer: 59

## 2011-07-01 DIAGNOSIS — J029 Acute pharyngitis, unspecified: Secondary | ICD-10-CM

## 2011-07-01 DIAGNOSIS — J209 Acute bronchitis, unspecified: Secondary | ICD-10-CM

## 2011-07-01 DIAGNOSIS — J019 Acute sinusitis, unspecified: Secondary | ICD-10-CM

## 2011-07-01 DIAGNOSIS — J111 Influenza due to unidentified influenza virus with other respiratory manifestations: Secondary | ICD-10-CM

## 2011-09-10 ENCOUNTER — Other Ambulatory Visit: Payer: Self-pay | Admitting: Physician Assistant

## 2012-03-05 ENCOUNTER — Ambulatory Visit: Payer: 59

## 2012-03-05 ENCOUNTER — Ambulatory Visit (INDEPENDENT_AMBULATORY_CARE_PROVIDER_SITE_OTHER): Payer: 59 | Admitting: Emergency Medicine

## 2012-03-05 VITALS — BP 141/85 | HR 52 | Temp 97.7°F | Resp 16 | Ht 63.5 in | Wt 205.0 lb

## 2012-03-05 DIAGNOSIS — M549 Dorsalgia, unspecified: Secondary | ICD-10-CM

## 2012-03-05 MED ORDER — MELOXICAM 7.5 MG PO TABS: ORAL_TABLET | ORAL | Status: DC

## 2012-03-05 MED ORDER — CYCLOBENZAPRINE HCL 10 MG PO TABS: ORAL_TABLET | ORAL | Status: DC

## 2012-03-05 NOTE — Progress Notes (Signed)
  Subjective:    Patient ID: Misty [name], female    DOB: August 17, 1965, 46 y.o.   MRN: [id number]  HPI Patient presents today with back tightness. She was at [employer] class last night and she could feel a twinge during warm-up. She goes to [employer] about 3 times a week. The pain is centralized in the lower back, more in the center. She states she has had some trouble with her back off and on over the last year or so. She denies any radicular symptoms with the back discomfort she is having.    Review of Systems her last menstrual period was one year ago     Objective:   Physical Exam there is minimal tenderness over the lower lumbar spine. She has good range of motion of the hips. She has negative straight leg raising signs bilaterally. I could not elicit good knee reflexes her ankle reflexes are 2+. Her motor strength is 5 out of 5 all muscle groups.  [facility] reading (PRIMARY) by  Dr. 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 She will have a muscle relaxant for nighttime.

## 2012-06-01 ENCOUNTER — Other Ambulatory Visit: Payer: Self-pay | Admitting: Obstetrics and Gynecology

## 2012-06-01 DIAGNOSIS — Z803 Family history of malignant neoplasm of breast: Secondary | ICD-10-CM

## 2012-06-01 DIAGNOSIS — Z1231 Encounter for screening mammogram for malignant neoplasm of breast: Secondary | ICD-10-CM

## 2012-07-02 ENCOUNTER — Ambulatory Visit
Admission: RE | Admit: 2012-07-02 | Discharge: 2012-07-02 | Disposition: A | Discharge status: Home / Self Care | Payer: 59 | Source: Ambulatory Visit | Attending: Obstetrics and Gynecology | Admitting: Obstetrics and Gynecology

## 2012-07-02 DIAGNOSIS — Z1231 Encounter for screening mammogram for malignant neoplasm of breast: Secondary | ICD-10-CM

## 2012-07-02 DIAGNOSIS — Z803 Family history of malignant neoplasm of breast: Secondary | ICD-10-CM

## 2012-07-12 ENCOUNTER — Ambulatory Visit (INDEPENDENT_AMBULATORY_CARE_PROVIDER_SITE_OTHER): Payer: 59 | Admitting: Internal Medicine

## 2012-07-12 VITALS — BP 147/88 | HR 61 | Temp 98.1°F | Resp 16 | Ht 62.75 in | Wt 215.4 lb

## 2012-07-12 DIAGNOSIS — L259 Unspecified contact dermatitis, unspecified cause: Secondary | ICD-10-CM

## 2012-07-12 DIAGNOSIS — I10 Essential (primary) hypertension: Secondary | ICD-10-CM | POA: Insufficient documentation

## 2012-07-12 MED ORDER — FLUOCINONIDE-E 0.05 % EX CREA: TOPICAL_CREAM | Freq: Two times a day (BID) | CUTANEOUS | Status: DC

## 2012-07-12 NOTE — Progress Notes (Signed)
  Subjective:    Patient ID: Misty [name], female    DOB: 02/21/1966, 47 y.o.   MRN: [id number]  HPI pruritic rash on shoulder and left trunk for 4 days/no known exposures No pain or paresthesias No intercurrent illness No new medications    Review of Systems     Objective:   Physical Exam Blood pressure 147/88 Weight 215 pounds On the left shoulder is a 0.5 cm x 3 cm erythematous vesicular lesion In the left axillary area or 3 separate stripes 0.3 cm x 5 or 6 cm long/minute vesicles in red papules       Assessment & Plan:  Problem #1 contact dermatitis lidex twice a day percent in control

## 2012-07-16 ENCOUNTER — Encounter: Payer: Self-pay | Admitting: Obstetrics and Gynecology

## 2012-07-16 ENCOUNTER — Ambulatory Visit (INDEPENDENT_AMBULATORY_CARE_PROVIDER_SITE_OTHER): Payer: 59 | Admitting: Obstetrics and Gynecology

## 2012-07-16 VITALS — BP 130/80 | HR 64 | Ht 62.0 in | Wt 214.0 lb

## 2012-07-16 DIAGNOSIS — Z124 Encounter for screening for malignant neoplasm of cervix: Secondary | ICD-10-CM

## 2012-07-16 NOTE — Progress Notes (Signed)
Last Pap: 01/23/11 WNL: Yes bv Regular Periods:no Contraception: post menopausal   Monthly Breast exam:yes Tetanus<71yrs:yes Nl.Bladder Function:yes Daily BMs:yes Healthy Diet:yes Calcium:no Mammogram:yes Date of Mammogram: 07/02/12 Exercise:yes Have often Exercise: once per week  Seatbelt: yes Abuse at home: no Stressful work:yes Sigmoid-colonoscopy: n/a Bone Density: No PCP: [facility] Change in PMH: arthritic changes in spine Change in [facility]: no changes BP 130/80  Pulse 64  Ht 5\' 2"  (1.575 m)  Wt 214 lb (97.07 kg)  BMI 39.14 kg/m2 Pt with complaints:no Physical Examination: General appearance - alert, well appearing, and in no distress Mental status - normal mood, behavior, speech, dress, motor activity, and thought processes Neck - supple, no significant adenopathy,  thyroid exam: thyroid is normal in size without nodules or tenderness Chest - clear to auscultation, no wheezes, rales or rhonchi, symmetric air entry Heart - normal rate and regular rhythm Abdomen - soft, nontender, nondistended, no masses or organomegaly Breasts - breasts appear normal, no suspicious masses, no skin or nipple changes or axillary nodes Pelvic - normal external genitalia, vulva, vagina, cervix, uterus and adnexa Rectal - rectal exam not indicated Back exam - full range of motion, no tenderness, palpable spasm or pain on motion Neurological - alert, oriented, normal speech, no focal findings or movement disorder noted Musculoskeletal - no joint tenderness, deformity or swelling Extremities - no edema, redness or tenderness in the calves or thighs Skin - normal coloration and turgor, no rashes, no suspicious skin lesions noted Routine exam Pap sent yes Mammogram due no nothing used for contraception RT 1 yr

## 2012-07-16 NOTE — Patient Instructions (Signed)

## 2012-07-19 LAB — PAP IG W/ RFLX HPV ASCU

## 2012-10-12 ENCOUNTER — Other Ambulatory Visit: Payer: Self-pay | Admitting: Family Medicine

## 2012-10-21 ENCOUNTER — Other Ambulatory Visit: Payer: Self-pay | Admitting: Family Medicine

## 2013-06-22 ENCOUNTER — Ambulatory Visit (INDEPENDENT_AMBULATORY_CARE_PROVIDER_SITE_OTHER): Payer: 59 | Admitting: Emergency Medicine

## 2013-06-22 VITALS — BP 143/98 | HR 79 | Temp 98.0°F | Resp 17 | Wt 216.0 lb

## 2013-06-22 DIAGNOSIS — S39012A Strain of muscle, fascia and tendon of lower back, initial encounter: Secondary | ICD-10-CM

## 2013-06-22 DIAGNOSIS — S335XXA Sprain of ligaments of lumbar spine, initial encounter: Secondary | ICD-10-CM

## 2013-06-22 DIAGNOSIS — M549 Dorsalgia, unspecified: Secondary | ICD-10-CM

## 2013-06-22 MED ORDER — MELOXICAM 7.5 MG PO TABS: ORAL_TABLET | ORAL | Status: DC

## 2013-06-22 NOTE — Patient Instructions (Signed)

## 2013-06-22 NOTE — Progress Notes (Signed)
   Subjective:    Patient ID: Misty [name], female    DOB: October 23, 1965, 47 y.o.   MRN: [id number]  This chart was scribed for Lesle Chris, MD by Blanchard Kelch, ED Scribe. The patient was seen in room 8. Patient's care was started at 10:39 AM.   HPI  Misty [name] is a 47 y.o. female with a history of recurrent back pain (1x/year) who presents to office complaining of constant, worsening lower back pain that began yesterday. She describes it as sore. She was unable to sleep well last night due to the pain. She states that she feels her back catch if she twists of bends a certain way. She denies pain in her extremities or bowel or bladder incontinence. She took a Tylenol last night without relief.     Past Medical History  Diagnosis Date  . Hypertension    No past surgical history on file. Family History  Problem Relation Age of Onset  . Breast cancer Sister   . Osteoporosis Mother      Review of Systems  Constitutional: Negative for fever.  HENT: Negative for drooling.   Eyes: Negative for discharge.  Respiratory: Negative for cough.   Cardiovascular: Negative for leg swelling.  Gastrointestinal: Negative for vomiting.  Endocrine: Negative for polyuria.  Genitourinary: Negative for hematuria.  Musculoskeletal: Positive for back pain. Negative for gait problem.  Skin: Negative for rash.  Allergic/Immunologic: Negative for immunocompromised state.  Neurological: Negative for speech difficulty.  Hematological: Negative for adenopathy.  Psychiatric/Behavioral: Positive for sleep disturbance. Negative for confusion.       Objective:   Physical Exam  Nursing note and vitals reviewed. General: Well-developed, well-nourished female in no acute distress; appearance consistent with age of record HENT: normocephalic; atraumatic Eyes: pupils equal, round and reactive to light; extraocular muscles intact Neck: supple Heart: regular rate and rhythm; no murmurs, rubs or  gallops Lungs: clear to auscultation bilaterally Abdomen: soft; nondistended; nontender; no masses or hepatosplenomegaly; bowel sounds present Extremities: No deformity; full range of motion; pulses normal Musculoskeletal:  Neurologic: Awake, alert and oriented; motor function intact in all extremities and symmetric; no facial droop Skin: Warm and dry Psychiatric: Normal mood and affect       Assessment & Plan:  Patient placed on meloxicam. Referral made to the orthopedist regarding her back disease. She will probably need physical therapy and core muscle strengthening exercises .   I personally performed the services described in this documentation, which was scribed in my presence. The recorded information has been reviewed and is accurate.

## 2013-10-03 ENCOUNTER — Other Ambulatory Visit: Payer: Self-pay | Admitting: Internal Medicine

## 2014-04-10 ENCOUNTER — Other Ambulatory Visit: Payer: Self-pay

## 2014-04-10 DIAGNOSIS — Z1231 Encounter for screening mammogram for malignant neoplasm of breast: Secondary | ICD-10-CM

## 2014-04-14 ENCOUNTER — Ambulatory Visit
Admission: RE | Admit: 2014-04-14 | Discharge: 2014-04-14 | Disposition: A | Discharge status: Home / Self Care | Payer: 59 | Source: Ambulatory Visit

## 2014-04-14 DIAGNOSIS — Z1231 Encounter for screening mammogram for malignant neoplasm of breast: Secondary | ICD-10-CM

## 2015-01-01 ENCOUNTER — Other Ambulatory Visit: Payer: Self-pay | Admitting: Internal Medicine

## 2015-01-01 DIAGNOSIS — R1011 Right upper quadrant pain: Secondary | ICD-10-CM

## 2015-01-05 ENCOUNTER — Ambulatory Visit
Admission: RE | Admit: 2015-01-05 | Discharge: 2015-01-05 | Disposition: A | Discharge status: Home / Self Care | Payer: 59 | Source: Ambulatory Visit | Attending: Internal Medicine | Admitting: Internal Medicine

## 2015-01-05 DIAGNOSIS — R1011 Right upper quadrant pain: Secondary | ICD-10-CM

## 2015-12-07 IMAGING — US US ABDOMEN LIMITED
1 series · 14 of 25 positions shown · non-contrast
Comparison: CT scan and 08/09/2009.

CLINICAL DATA: Right upper quadrant abdominal pain for 3 weeks.

EXAM:
US ABDOMEN LIMITED - RIGHT UPPER QUADRANT

[Series 1: us abdomen limited · 0.20mm/px · 14 of 42 slices shown]
[im 1/42]
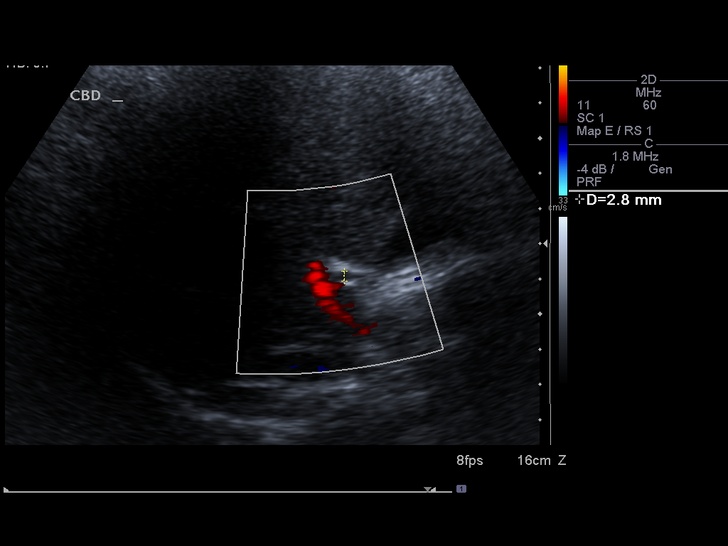
[im 4/42]
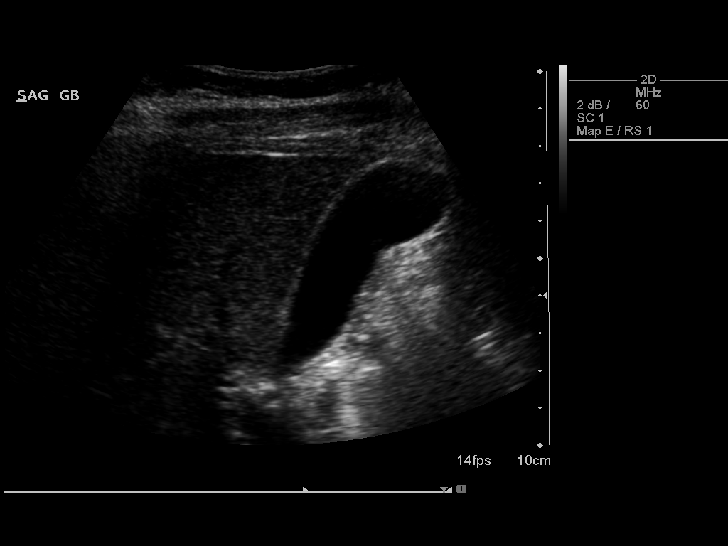
[im 7/42]
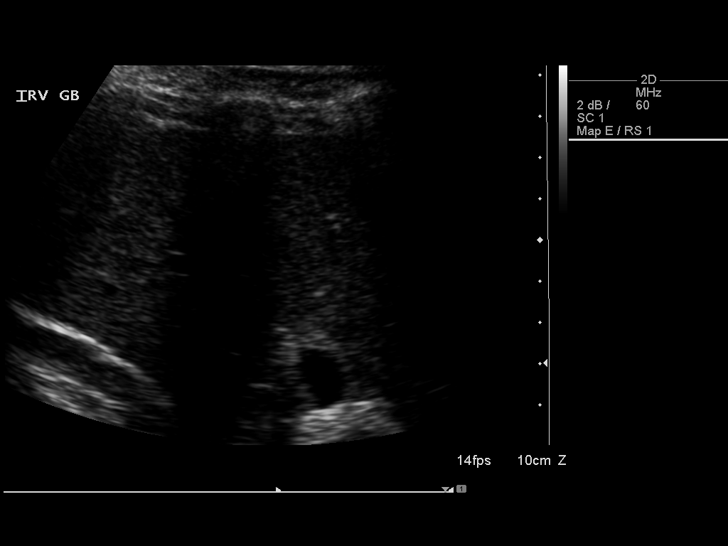
[im 11/42]
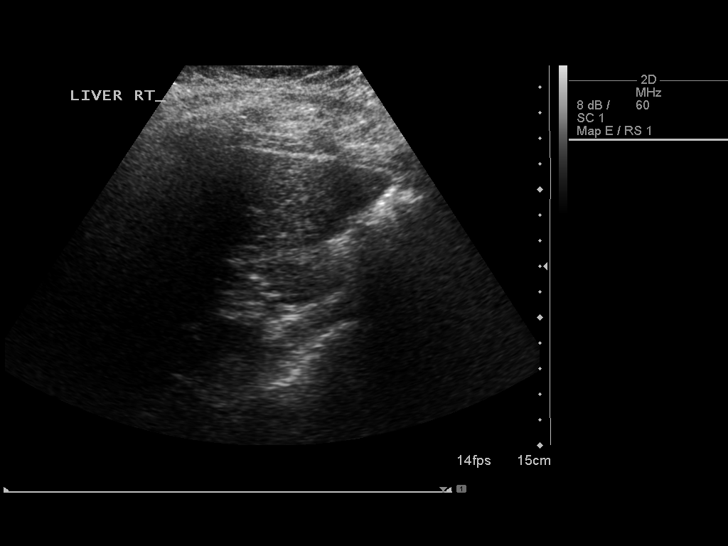
[im 14/42]
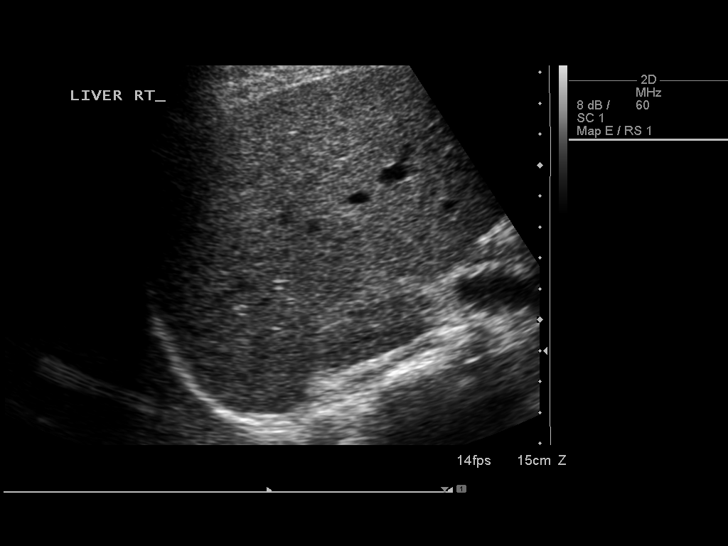
[im 16/42]
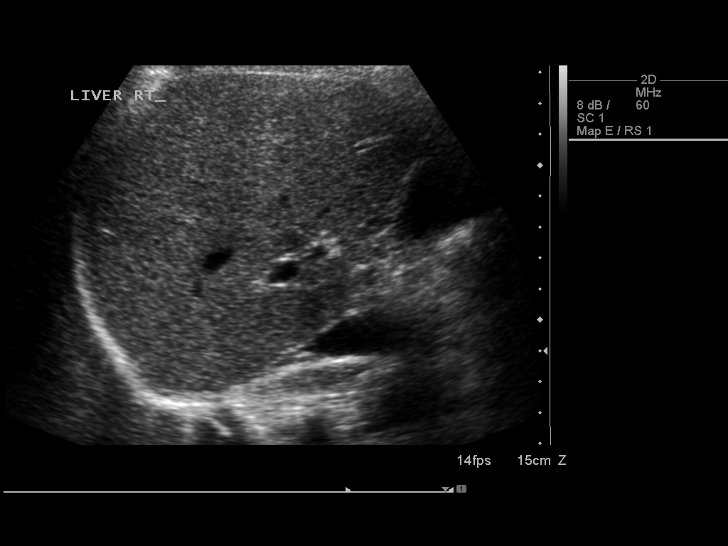
[im 19/42]
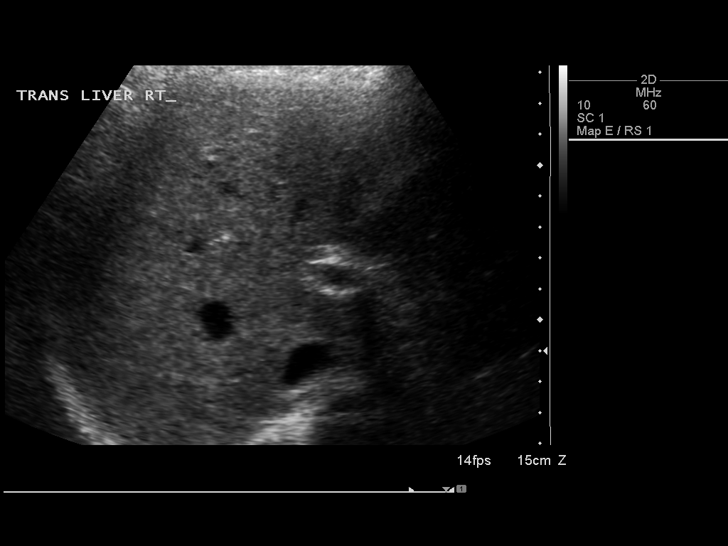
[im 23/42]
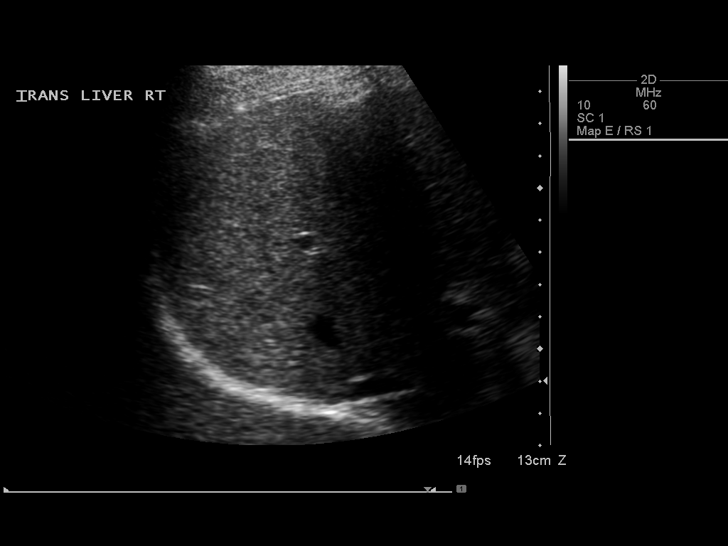
[im 26/42]
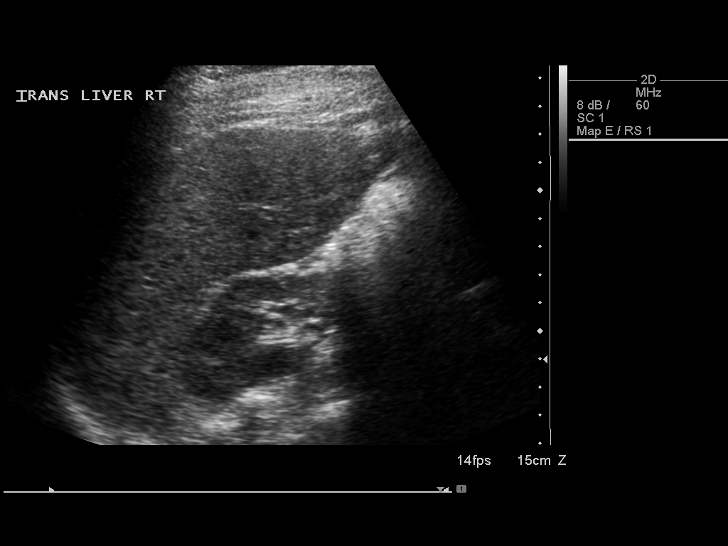
[im 28/42]
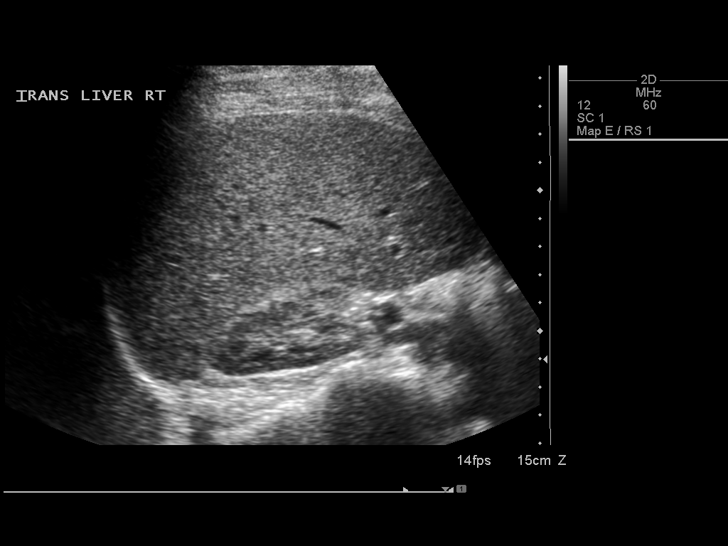
[im 31/42]
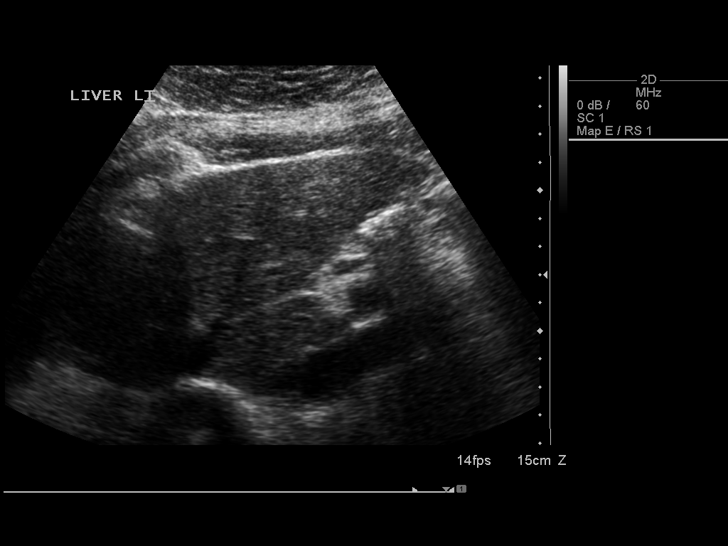
[im 35/42]
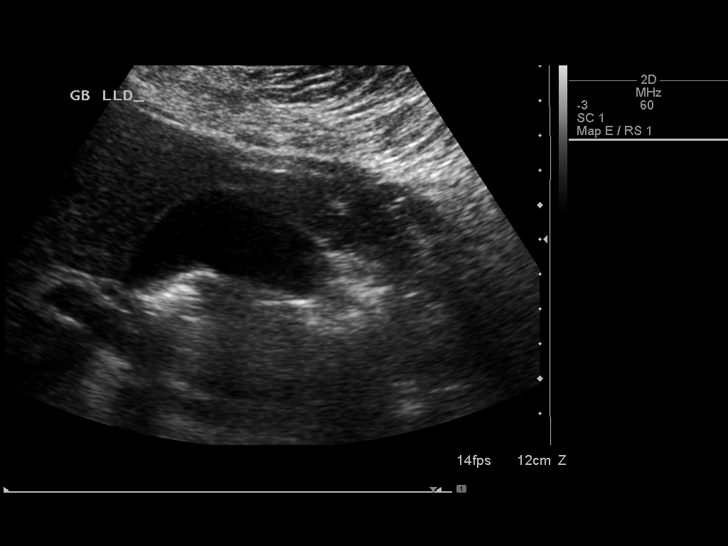
[im 38/42]
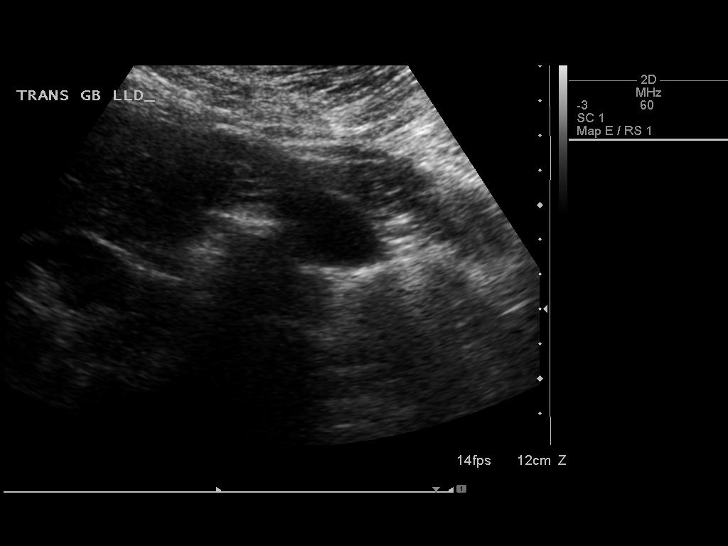
[im 42/42]
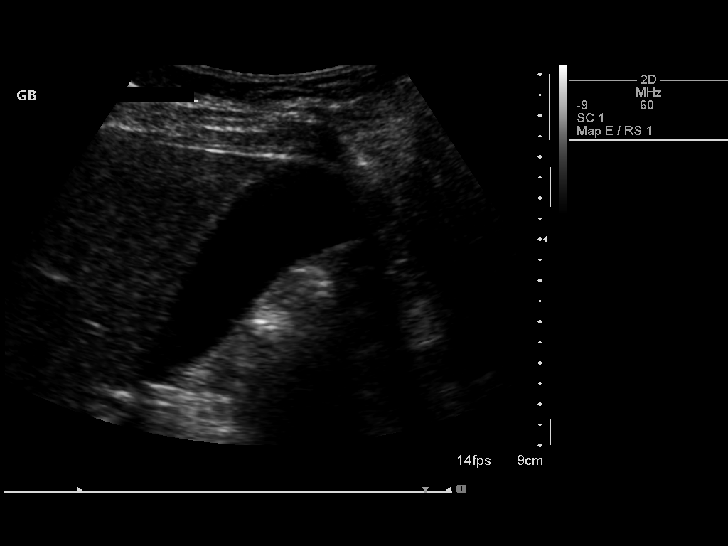

[14 of 25 positions shown; findings below may reference images not displayed]

FINDINGS: Gallbladder:

No gallstones or wall thickening visualized. No sonographic Murphy
sign noted.

Common bile duct:

Diameter: 3.0 mm

Liver:

Normal echogenicity without focal lesion or biliary dilatation.
IMPRESSION: Unremarkable right upper quadrant abdominal ultrasound examination.

## 2016-06-10 ENCOUNTER — Ambulatory Visit (INDEPENDENT_AMBULATORY_CARE_PROVIDER_SITE_OTHER): Payer: 59 | Admitting: Physician Assistant

## 2016-06-10 ENCOUNTER — Ambulatory Visit (INDEPENDENT_AMBULATORY_CARE_PROVIDER_SITE_OTHER): Payer: 59

## 2016-06-10 VITALS — BP 146/90 | HR 76 | Temp 98.2°F | Resp 17 | Ht 62.5 in | Wt 210.0 lb

## 2016-06-10 DIAGNOSIS — M545 Low back pain, unspecified: Secondary | ICD-10-CM

## 2016-06-10 LAB — POCT URINALYSIS DIP (MANUAL ENTRY)
Blood, UA: NEGATIVE
Glucose, UA: NEGATIVE
Leukocytes, UA: NEGATIVE
Nitrite, UA: NEGATIVE
Spec Grav, UA: >=1.03
Urobilinogen, UA: 0.2
pH, UA: 5.5

## 2016-06-10 LAB — POC MICROSCOPIC URINALYSIS (UMFC)

## 2016-06-10 MED ORDER — MELOXICAM 7.5 MG PO TABS: ORAL_TABLET | ORAL | 0 refills | Status: DC

## 2016-06-10 MED ORDER — CYCLOBENZAPRINE HCL 5 MG PO TABS: 5.0000 mg | ORAL_TABLET | Freq: Three times a day (TID) | ORAL | 0 refills | Status: DC | PRN

## 2016-06-10 NOTE — Progress Notes (Signed)
06/10/2016 at 9:01 PM  Jacolyn Reedy / DOB: Apr 11, 1966 / MRN: [id number]  The patient has Hypertension on her problem list.  SUBJECTIVE  Misty [name] is a 50 y.o. female who complains of right low dull back pain x 3 days. Denies urinary/bowel incontinence, saddle anesthesia, or loss of ROM. She has not had an acute injury. The only thing she has done different is hold her garage up for quite sometime one week ago. She has tried ibuprofen with mild relief of back pain. Of note, pt has a history of back pain. She states it has been over a year since she has had a flare. She went to PT for this issue and it helped quite a bit. She only drinks one bottle a day.   She  has a past medical history of Hypertension.    Medications reviewed and updated by myself where necessary, and exist elsewhere in the encounter.   Ms. Bonafede has No Known Allergies. She  reports that she has never smoked. She does not have any smokeless tobacco history on file. She reports that she does not drink alcohol or use drugs. She  has no sexual activity history on file. The patient  has no past surgical history on file.  Her family history includes Breast cancer in her sister; Osteoporosis in her mother.  Review of Systems  Constitutional: Negative for chills, diaphoresis and fever.  HENT: Positive for congestion ( has since resolved from 2 days ago).   Eyes: Negative for blurred vision, double vision and photophobia.  Gastrointestinal: Positive for diarrhea (intermittent) and nausea (2 days ago, has since resolved). Negative for abdominal pain, blood in stool and vomiting.  Genitourinary: Negative for dysuria, flank pain, frequency, hematuria and urgency.  Neurological: Positive for headaches. Negative for dizziness.    OBJECTIVE  Her  height is 5' 2.5" (1.588 m) and weight is 210 lb (95.3 kg). Her oral temperature is 98.2 F (36.8 C). Her blood pressure is 146/90 (abnormal) and her pulse is 76.  Her respiration is 17 and oxygen saturation is 98%.  The patient's body mass index is 37.8 kg/m.  Physical Exam  Constitutional: She is oriented to person, place, and time. She appears well-developed and well-nourished. No distress.  HENT:  Head: Normocephalic and atraumatic.  Eyes: Conjunctivae are normal.  Neck: Normal range of motion.  Cardiovascular: Normal rate, regular rhythm and normal heart sounds.   Respiratory: Effort normal and breath sounds normal.  GI: Soft. Bowel sounds are normal. There is no tenderness. There is no rigidity, no guarding, no CVA tenderness, no tenderness at McBurney's point and negative Murphy's sign.  Musculoskeletal:       Thoracic back: Normal.       Lumbar back: She exhibits tenderness (upon palpation of right musculature ) and bony tenderness. She exhibits normal range of motion and no swelling.  Neurological: She is alert and oriented to person, place, and time. She has normal reflexes.  Negative SLR biltaterally  Skin: Skin is warm and dry.  Psychiatric: She has a normal mood and affect.    Results for orders placed or performed in visit on 06/10/16 (from the past 24 hour(s))  POCT Microscopic Urinalysis ([facility])     Status: Abnormal   Collection Time: 06/10/16  6:11 PM  Result Value Ref Range   WBC,UR,HPF,POC Few (A) None WBC/hpf   RBC,UR,HPF,POC None None RBC/hpf   Bacteria Few (A) None, Too numerous to count   Mucus Present (A) Absent  Epithelial Cells, UR Per Microscopy Few (A) None, Too numerous to count cells/hpf  POCT urinalysis dipstick     Status: Abnormal   Collection Time: 06/10/16  6:11 PM  Result Value Ref Range   Color, UA yellow yellow   Clarity, UA hazy (A) clear   Glucose, UA negative negative   Bilirubin, UA small (A) negative   Ketones, POC UA trace (5) (A) negative   Spec Grav, UA >=1.030    Blood, UA negative negative   pH, UA 5.5    Protein Ur, POC trace (A) negative   Urobilinogen, UA 0.2    Nitrite, UA Negative  Negative   Leukocytes, UA Negative Negative   No results found.  ASSESSMENT & PLAN   Novia was seen today for sinusitis, back pain and nausea.  Diagnoses and all orders for this visit:  Acute right-sided low back pain without sciatica -     POCT Microscopic Urinalysis ([facility]) -     POCT urinalysis dipstick -     DG Lumbar Spine Complete; Future -     meloxicam (MOBIC) 7.5 MG tablet; Take 1-2 tablets daily for back pain -     cyclobenzaprine (FLEXERIL) 5 MG tablet; Take 1 tablet (5 mg total) by mouth 3 (three) times daily as needed for muscle spasms.  -Recommend heat to affected area and increase water consumption to at least 64 oz.  The patient was advised to call or come back to clinic if she does not see an improvement in symptoms, or worsens with the above plan.   Tenna Delaine, PA-C [facility] 06/10/2016 9:01 PM

## 2016-06-10 NOTE — Patient Instructions (Signed)
     IF you received an x-ray today, you will receive an invoice from [facility]. Please contact [facility] at [phone] with questions or concerns regarding your invoice.   IF you received labwork today, you will receive an invoice from [facility]. Please contact [facility] at [phone] with questions or concerns regarding your invoice.   Our billing staff will not be able to assist you with questions regarding bills from these companies.  You will be contacted with the lab results as soon as they are available. The fastest way to get your results is to activate your My Chart account. Instructions are located on the last page of this paperwork. If you have not heard from us regarding the results in 2 weeks, please contact this office.      

## 2016-09-24 DIAGNOSIS — J320 Chronic maxillary sinusitis: Secondary | ICD-10-CM | POA: Diagnosis not present

## 2016-09-24 DIAGNOSIS — I10 Essential (primary) hypertension: Secondary | ICD-10-CM | POA: Diagnosis not present

## 2016-09-24 DIAGNOSIS — Z79899 Other long term (current) drug therapy: Secondary | ICD-10-CM | POA: Diagnosis not present

## 2016-10-08 DIAGNOSIS — Z1211 Encounter for screening for malignant neoplasm of colon: Secondary | ICD-10-CM | POA: Diagnosis not present

## 2016-10-16 DIAGNOSIS — Z124 Encounter for screening for malignant neoplasm of cervix: Secondary | ICD-10-CM | POA: Diagnosis not present

## 2016-10-16 DIAGNOSIS — Z01419 Encounter for gynecological examination (general) (routine) without abnormal findings: Secondary | ICD-10-CM | POA: Diagnosis not present

## 2016-12-11 DIAGNOSIS — D124 Benign neoplasm of descending colon: Secondary | ICD-10-CM | POA: Diagnosis not present

## 2016-12-11 DIAGNOSIS — D125 Benign neoplasm of sigmoid colon: Secondary | ICD-10-CM | POA: Diagnosis not present

## 2016-12-11 DIAGNOSIS — Z1211 Encounter for screening for malignant neoplasm of colon: Secondary | ICD-10-CM | POA: Diagnosis not present

## 2016-12-11 DIAGNOSIS — K635 Polyp of colon: Secondary | ICD-10-CM | POA: Diagnosis not present

## 2017-07-28 DIAGNOSIS — J069 Acute upper respiratory infection, unspecified: Secondary | ICD-10-CM | POA: Diagnosis not present

## 2017-09-05 DIAGNOSIS — R0981 Nasal congestion: Secondary | ICD-10-CM | POA: Diagnosis not present

## 2017-11-19 DIAGNOSIS — Z124 Encounter for screening for malignant neoplasm of cervix: Secondary | ICD-10-CM | POA: Diagnosis not present

## 2017-11-19 DIAGNOSIS — R350 Frequency of micturition: Secondary | ICD-10-CM | POA: Diagnosis not present

## 2017-11-19 DIAGNOSIS — Z01419 Encounter for gynecological examination (general) (routine) without abnormal findings: Secondary | ICD-10-CM | POA: Diagnosis not present

## 2018-01-14 DIAGNOSIS — Z0000 Encounter for general adult medical examination without abnormal findings: Secondary | ICD-10-CM | POA: Diagnosis not present

## 2018-01-14 DIAGNOSIS — R001 Bradycardia, unspecified: Secondary | ICD-10-CM | POA: Diagnosis not present

## 2018-01-14 DIAGNOSIS — E559 Vitamin D deficiency, unspecified: Secondary | ICD-10-CM | POA: Diagnosis not present

## 2018-01-14 DIAGNOSIS — I10 Essential (primary) hypertension: Secondary | ICD-10-CM | POA: Diagnosis not present

## 2018-06-29 ENCOUNTER — Other Ambulatory Visit: Payer: Self-pay | Admitting: Internal Medicine

## 2018-07-15 DIAGNOSIS — J069 Acute upper respiratory infection, unspecified: Secondary | ICD-10-CM | POA: Diagnosis not present

## 2018-07-17 DIAGNOSIS — R5383 Other fatigue: Secondary | ICD-10-CM | POA: Diagnosis not present

## 2018-07-17 DIAGNOSIS — Z20828 Contact with and (suspected) exposure to other viral communicable diseases: Secondary | ICD-10-CM | POA: Diagnosis not present

## 2018-07-17 DIAGNOSIS — R509 Fever, unspecified: Secondary | ICD-10-CM | POA: Diagnosis not present

## 2018-07-17 DIAGNOSIS — J09X2 Influenza due to identified novel influenza A virus with other respiratory manifestations: Secondary | ICD-10-CM | POA: Diagnosis not present

## 2018-07-19 ENCOUNTER — Ambulatory Visit: Payer: 59 | Admitting: Internal Medicine

## 2018-08-02 ENCOUNTER — Ambulatory Visit: Payer: 59 | Admitting: Internal Medicine

## 2018-08-02 ENCOUNTER — Encounter: Payer: Self-pay | Admitting: Internal Medicine

## 2018-08-02 VITALS — BP 142/90 | HR 64 | Temp 98.0°F | Ht 63.0 in | Wt 218.0 lb

## 2018-08-02 DIAGNOSIS — I10 Essential (primary) hypertension: Secondary | ICD-10-CM

## 2018-08-02 DIAGNOSIS — Z6838 Body mass index (BMI) 38.0-38.9, adult: Secondary | ICD-10-CM

## 2018-08-02 DIAGNOSIS — R7309 Other abnormal glucose: Secondary | ICD-10-CM | POA: Diagnosis not present

## 2018-08-02 NOTE — Progress Notes (Signed)
Subjective:     Patient ID: Misty [name] , female    DOB: 10-Apr-1966 , 53 y.o.   MRN: [id number]   Chief Complaint  Patient presents with  . Hypertension    HPI  Hypertension  This is a chronic problem. The current episode started more than 1 year ago. The problem has been gradually improving since onset. The problem is controlled. Pertinent negatives include no blurred vision, chest pain, palpitations or shortness of breath. Risk factors for coronary artery disease include obesity, post-menopausal state and sedentary lifestyle. The current treatment provides mild improvement. Compliance problems include exercise.      Past Medical History:  Diagnosis Date  . Hypertension      Family History  Problem Relation Age of Onset  . Breast cancer Sister   . Osteoporosis Mother      Current Outpatient Medications:  .  Cholecalciferol (VITAMIN D3 PO), Take 1 tablet by mouth daily., Disp: , Rfl:  .  Magnesium Hydroxide (MAGNESIA PO), Take 1 tablet by mouth daily., Disp: , Rfl:  .  Multiple Vitamins-Minerals (CENTRUM ADULTS PO), Take 1 tablet by mouth daily., Disp: , Rfl:  .  Multiple Vitamins-Minerals (MULTIVITAMIN PO), Take by mouth., Disp: , Rfl:  .  triamterene-hydrochlorothiazide (DYAZIDE) 37.5-25 MG capsule, TAKE 1 CAPSULE BY MOUTH EVERY DAY, Disp: 30 capsule, Rfl: 0   No Known Allergies   Review of Systems  Constitutional: Negative.   Eyes: Negative for blurred vision.  Respiratory: Negative.  Negative for shortness of breath.   Cardiovascular: Negative.  Negative for chest pain and palpitations.  Gastrointestinal: Negative.   Genitourinary: Negative.   Neurological: Negative.   Psychiatric/Behavioral: Negative.      Today's Vitals   08/02/18 1553  BP: (!) 142/90  Pulse: 64  Temp: 98 F (36.7 C)  TempSrc: Oral  SpO2: 95%  Weight: 218 lb (98.9 kg)  Height: '5\' 3"'  (1.6 m)  PainSc: 0-No pain   Body mass index is 38.62 kg/m.   Objective:  Physical  Exam Vitals signs and nursing note reviewed.  Constitutional:      Appearance: Normal appearance. She is obese.  HENT:     Head: Normocephalic and atraumatic.  Cardiovascular:     Rate and Rhythm: Normal rate and regular rhythm.     Heart sounds: Normal heart sounds.  Pulmonary:     Effort: Pulmonary effort is normal.     Breath sounds: Normal breath sounds.  Skin:    General: Skin is warm.  Neurological:     General: No focal deficit present.     Mental Status: She is alert and oriented to person, place, and time.  Psychiatric:        Mood and Affect: Mood normal.         Assessment And Plan:     1. Benign hypertension  Uncontrolled. She is encouraged to avoid adding salt to her foods. She will continue with current meds for now. She will rto in 3 months. If still elevated at that time, I will add another medication to her current regimen. She is in agreement with her treatment plan.   - CMP14+EGFR  2. Other abnormal glucose  HER A1C HAS BEEN ELEVATED IN THE PAST. I WILL CHECK AN A1C, BMET TODAY. SHE WAS ENCOURAGED TO AVOID SUGARY BEVERAGES AND PROCESSED FOODS INCLUDNG BREADS, RICE AND PASTA.  - Hemoglobin A1c  3. Class 2 severe obesity due to excess calories with serious comorbidity and body mass index (BMI) of 38.0  to 38.9 in adult [facility])  She admits that she has not been exercising as much as she should.  She is encouraged to strive for BMI less than 30 to decrease cardiac risk.  She is advised to work towards 30 minutes five days weekly.   Maximino Greenland, MD

## 2018-08-03 LAB — CMP14+EGFR
ALT: 12 IU/L (ref 0–32)
AST: 17 IU/L (ref 0–40)
Albumin/Globulin Ratio: 1.5 (ref 1.2–2.2)
Albumin: 4.3 g/dL (ref 3.8–4.9)
Alkaline Phosphatase: 85 IU/L (ref 39–117)
BUN/Creatinine Ratio: 12 (ref 9–23)
BUN: 10 mg/dL (ref 6–24)
Bilirubin Total: 0.3 mg/dL (ref 0.0–1.2)
CO2: 27 mmol/L (ref 20–29)
Calcium: 9.8 mg/dL (ref 8.7–10.2)
Chloride: 101 mmol/L (ref 96–106)
Creatinine, Ser: 0.86 mg/dL (ref 0.57–1.00)
GFR calc Af Amer: 89 mL/min/{1.73_m2} (ref >59)
GFR calc non Af Amer: 77 mL/min/{1.73_m2} (ref >59)
Globulin, Total: 2.8 g/dL (ref 1.5–4.5)
Glucose: 77 mg/dL (ref 65–99)
Potassium: 4.4 mmol/L (ref 3.5–5.2)
Sodium: 140 mmol/L (ref 134–144)
Total Protein: 7.1 g/dL (ref 6.0–8.5)

## 2018-08-03 LAB — HEMOGLOBIN A1C
Est. average glucose Bld gHb Est-mCnc: 117 mg/dL
Hgb A1c MFr Bld: 5.7 % — ABNORMAL HIGH (ref 4.8–5.6)

## 2018-08-07 ENCOUNTER — Other Ambulatory Visit: Payer: Self-pay | Admitting: Internal Medicine

## 2018-09-30 ENCOUNTER — Telehealth: Payer: Self-pay

## 2018-09-30 NOTE — Telephone Encounter (Signed)
Returned the pt's called and left a message for her to go to urgent care for evaluation of the sore throat, sinus drainage and headache.

## 2018-09-30 NOTE — Telephone Encounter (Signed)
Left the pt a message that the office is now able to do phone visits and for the pt to call the office back if she would like a phone visit.

## 2018-10-01 ENCOUNTER — Ambulatory Visit (INDEPENDENT_AMBULATORY_CARE_PROVIDER_SITE_OTHER): Payer: 59 | Admitting: Nurse Practitioner

## 2018-10-01 ENCOUNTER — Encounter: Payer: Self-pay | Admitting: Nurse Practitioner

## 2018-10-01 DIAGNOSIS — J019 Acute sinusitis, unspecified: Secondary | ICD-10-CM

## 2018-10-01 DIAGNOSIS — Z7189 Other specified counseling: Secondary | ICD-10-CM

## 2018-10-01 MED ORDER — AMOXICILLIN-POT CLAVULANATE 875-125 MG PO TABS: 1.0000 | ORAL_TABLET | Freq: Two times a day (BID) | ORAL | 0 refills | Status: AC

## 2018-10-01 MED ORDER — MOMETASONE FUROATE 50 MCG/ACT NA SUSP: 2.0000 | Freq: Every day | NASAL | 2 refills | Status: DC

## 2018-10-01 NOTE — Progress Notes (Addendum)
This visit type was conducted due to national recommendations for restrictions regarding the COVID-19 Pandemic (e.g. social distancing).  This format is felt to be most appropriate for this patient at this time.  All issues noted in this document were discussed and addressed.  No physical exam was performed (except for noted visual exam findings with Video Visits).  Please refer to the patient's chart (MyChart message for video visits and phone note for telephone visits) for the patient's consent to telehealth for [place].   Subjective:     Patient ID: Misty [name] , female    DOB: 07-15-1965 , 53 y.o.   MRN: [id number]  Virtual Visit via Video Note  I connected with Misty [name] at home on 10/01/18 at  2:30 PM EDT by telephone and verified that I am speaking with the correct person using two identifiers.   I discussed the limitations, risks, security and privacy concerns of performing an evaluation and management service by telephone and the availability of in person appointments. I also discussed with the patient that there may be a patient responsible charge related to this service. The patient expressed understanding and agreed to proceed.  Chief Complaint  Patient presents with  . Sinus Problem    History of Present Illness:   Not been exposed to anyone positive for Covid19  Sinus Problem  This is a new problem. The current episode started in the past 7 days. The problem has been gradually worsening since onset. There has been no fever (her temperature is 96.4 at work). The pain is mild. Associated symptoms include congestion and headaches. Pertinent negatives include no coughing, sinus pressure (bilateral temporals and frontal) or sore throat. Past treatments include acetaminophen (tylenol sinus).     Past Medical History:  Diagnosis Date  . Hypertension      Family History  Problem Relation Age of Onset  . Breast cancer Sister   . Osteoporosis Mother       Current Outpatient Medications:  .  amoxicillin-clavulanate (AUGMENTIN) 875-125 MG tablet, Take 1 tablet by mouth 2 (two) times daily for 7 days., Disp: 14 tablet, Rfl: 0 .  Cholecalciferol (VITAMIN D3 PO), Take 1 tablet by mouth daily., Disp: , Rfl:  .  Magnesium Hydroxide (MAGNESIA PO), Take 1 tablet by mouth daily., Disp: , Rfl:  .  Multiple Vitamins-Minerals (CENTRUM ADULTS PO), Take 1 tablet by mouth daily., Disp: , Rfl:  .  Multiple Vitamins-Minerals (MULTIVITAMIN PO), Take by mouth., Disp: , Rfl:  .  triamterene-hydrochlorothiazide (DYAZIDE) 37.5-25 MG capsule, TAKE 1 CAPSULE BY MOUTH EVERY DAY, Disp: 30 capsule, Rfl: 4   No Known Allergies   Review of Systems  Constitutional: Negative for fever.  HENT: Positive for congestion. Negative for sinus pressure (bilateral temporals and frontal) and sore throat.   Respiratory: Negative for cough.   Neurological: Positive for headaches.     There were no vitals filed for this visit.  Observations/Objective: She is having intermittent episodes of sniffing during the visit.   Points to her temporal areas of her head and frontal area otherwise not an any acute distress.       Assessment and Plan: 1. Acute non-recurrent sinusitis, unspecified location  She is at day 7, going into the weekend and has not had good results with claritin or nasal spray (unsure of name).  Will treat with augmentin if not better will need to be seen in the office.  - amoxicillin-clavulanate (AUGMENTIN) 875-125 MG tablet; Take 1 tablet by mouth  2 (two) times daily for 7 days.  Dispense: 14 tablet; Refill: 0   Follow Up Instructions: Return call to the office if not any better after treatment with antibiotics.  I discussed the assessment and treatment plan with the patient. The patient was provided an opportunity to ask questions and all were answered. The patient agreed with the plan and demonstrated an understanding of the instructions.   The  patient was advised to call back or seek an in-person evaluation if the symptoms worsen or if the condition fails to improve as anticipated.  COVID-19 Education: The signs and symptoms of COVID-19 were discussed with the patient and how to seek care for testing (follow up with PCP or arrange E-visit).  The importance of social distancing was discussed today.   Patient Risk:   After full review of this patients clinical status, I feel that they are at least moderate risk at this time.   I provided 15 minutes of non-face-to-face time during this encounter.   Minette Brine, FNP

## 2018-10-28 ENCOUNTER — Ambulatory Visit: Payer: 59 | Admitting: Internal Medicine

## 2018-10-28 ENCOUNTER — Other Ambulatory Visit: Payer: Self-pay

## 2018-10-28 ENCOUNTER — Encounter: Payer: Self-pay | Admitting: Internal Medicine

## 2018-10-28 VITALS — BP 130/96 | HR 59 | Temp 98.0°F | Ht 63.0 in | Wt 216.4 lb

## 2018-10-28 DIAGNOSIS — I10 Essential (primary) hypertension: Secondary | ICD-10-CM

## 2018-10-28 DIAGNOSIS — Z23 Encounter for immunization: Secondary | ICD-10-CM | POA: Diagnosis not present

## 2018-10-28 DIAGNOSIS — R7309 Other abnormal glucose: Secondary | ICD-10-CM | POA: Diagnosis not present

## 2018-10-28 DIAGNOSIS — Z6838 Body mass index (BMI) 38.0-38.9, adult: Secondary | ICD-10-CM

## 2018-10-28 MED ORDER — OLMESARTAN MEDOXOMIL-HCTZ 20-12.5 MG PO TABS: 1.0000 | ORAL_TABLET | Freq: Every day | ORAL | 1 refills | Status: DC

## 2018-10-28 MED ORDER — PNEUMOCOCCAL 13-VAL CONJ VACC IM SUSP: 0.5000 mL | INTRAMUSCULAR | 0 refills | Status: AC

## 2018-10-28 NOTE — Patient Instructions (Signed)
DASH Eating Plan  DASH stands for "Dietary Approaches to Stop Hypertension." The DASH eating plan is a healthy eating plan that has been shown to reduce high blood pressure (hypertension). It may also reduce your risk for type 2 diabetes, heart disease, and stroke. The DASH eating plan may also help with weight loss.  What are tips for following this plan?    General guidelines   Avoid eating more than 2,300 mg (milligrams) of salt (sodium) a day. If you have hypertension, you may need to reduce your sodium intake to 1,500 mg a day.   Limit alcohol intake to no more than 1 drink a day for nonpregnant women and 2 drinks a day for men. One drink equals 12 oz of beer, 5 oz of wine, or 1 oz of hard liquor.   Work with your health care provider to maintain a healthy body weight or to lose weight. Ask what an ideal weight is for you.   Get at least 30 minutes of exercise that causes your heart to beat faster (aerobic exercise) most days of the week. Activities may include walking, swimming, or biking.   Work with your health care provider or diet and nutrition specialist (dietitian) to adjust your eating plan to your individual calorie needs.  Reading food labels     Check food labels for the amount of sodium per serving. Choose foods with less than 5 percent of the Daily Value of sodium. Generally, foods with less than 300 mg of sodium per serving fit into this eating plan.   To find whole grains, look for the word "whole" as the first word in the ingredient list.  Shopping   Buy products labeled as "low-sodium" or "no salt added."   Buy fresh foods. Avoid canned foods and premade or frozen meals.  Cooking   Avoid adding salt when cooking. Use salt-free seasonings or herbs instead of table salt or sea salt. Check with your health care provider or pharmacist before using salt substitutes.   Do not fry foods. Cook foods using healthy methods such as baking, boiling, grilling, and broiling instead.   Cook with  heart-healthy oils, such as olive, canola, soybean, or sunflower oil.  Meal planning   Eat a balanced diet that includes:  ? 5 or more servings of fruits and vegetables each day. At each meal, try to fill half of your plate with fruits and vegetables.  ? Up to 6-8 servings of whole grains each day.  ? Less than 6 oz of lean meat, poultry, or fish each day. A 3-oz serving of meat is about the same size as a deck of cards. One egg equals 1 oz.  ? 2 servings of low-fat dairy each day.  ? A serving of nuts, seeds, or beans 5 times each week.  ? Heart-healthy fats. Healthy fats called Omega-3 fatty acids are found in foods such as flaxseeds and coldwater fish, like sardines, salmon, and mackerel.   Limit how much you eat of the following:  ? Canned or prepackaged foods.  ? Food that is high in trans fat, such as fried foods.  ? Food that is high in saturated fat, such as fatty meat.  ? Sweets, desserts, sugary drinks, and other foods with added sugar.  ? Full-fat dairy products.   Do not salt foods before eating.   Try to eat at least 2 vegetarian meals each week.   Eat more home-cooked food and less restaurant, buffet, and fast food.     When eating at a restaurant, ask that your food be prepared with less salt or no salt, if possible.  What foods are recommended?  The items listed may not be a complete list. Talk with your dietitian about what dietary choices are best for you.  Grains  Whole-grain or whole-wheat bread. Whole-grain or whole-wheat pasta. Brown rice. Oatmeal. Quinoa. Bulgur. Whole-grain and low-sodium cereals. Pita bread. Low-fat, low-sodium crackers. Whole-wheat flour tortillas.  Vegetables  Fresh or frozen vegetables (raw, steamed, roasted, or grilled). Low-sodium or reduced-sodium tomato and vegetable juice. Low-sodium or reduced-sodium tomato sauce and tomato paste. Low-sodium or reduced-sodium canned vegetables.  Fruits  All fresh, dried, or frozen fruit. Canned fruit in natural juice (without  added sugar).  Meat and other protein foods  Skinless chicken or turkey. Ground chicken or turkey. Pork with fat trimmed off. Fish and seafood. Egg whites. Dried beans, peas, or lentils. Unsalted nuts, nut butters, and seeds. Unsalted canned beans. Lean cuts of beef with fat trimmed off. Low-sodium, lean deli meat.  Dairy  Low-fat (1%) or fat-free (skim) milk. Fat-free, low-fat, or reduced-fat cheeses. Nonfat, low-sodium ricotta or cottage cheese. Low-fat or nonfat yogurt. Low-fat, low-sodium cheese.  Fats and oils  Soft margarine without trans fats. Vegetable oil. Low-fat, reduced-fat, or light mayonnaise and salad dressings (reduced-sodium). Canola, safflower, olive, soybean, and sunflower oils. Avocado.  Seasoning and other foods  Herbs. Spices. Seasoning mixes without salt. Unsalted popcorn and pretzels. Fat-free sweets.  What foods are not recommended?  The items listed may not be a complete list. Talk with your dietitian about what dietary choices are best for you.  Grains  Baked goods made with fat, such as croissants, muffins, or some breads. Dry pasta or rice meal packs.  Vegetables  Creamed or fried vegetables. Vegetables in a cheese sauce. Regular canned vegetables (not low-sodium or reduced-sodium). Regular canned tomato sauce and paste (not low-sodium or reduced-sodium). Regular tomato and vegetable juice (not low-sodium or reduced-sodium). Pickles. Olives.  Fruits  Canned fruit in a light or heavy syrup. Fried fruit. Fruit in cream or butter sauce.  Meat and other protein foods  Fatty cuts of meat. Ribs. Fried meat. Bacon. Sausage. Bologna and other processed lunch meats. Salami. Fatback. Hotdogs. Bratwurst. Salted nuts and seeds. Canned beans with added salt. Canned or smoked fish. Whole eggs or egg yolks. Chicken or turkey with skin.  Dairy  Whole or 2% milk, cream, and half-and-half. Whole or full-fat cream cheese. Whole-fat or sweetened yogurt. Full-fat cheese. Nondairy creamers. Whipped toppings.  Processed cheese and cheese spreads.  Fats and oils  Butter. Stick margarine. Lard. Shortening. Ghee. Bacon fat. Tropical oils, such as coconut, palm kernel, or palm oil.  Seasoning and other foods  Salted popcorn and pretzels. Onion salt, garlic salt, seasoned salt, table salt, and sea salt. Worcestershire sauce. Tartar sauce. Barbecue sauce. Teriyaki sauce. Soy sauce, including reduced-sodium. Steak sauce. Canned and packaged gravies. Fish sauce. Oyster sauce. Cocktail sauce. Horseradish that you find on the shelf. Ketchup. Mustard. Meat flavorings and tenderizers. Bouillon cubes. Hot sauce and Tabasco sauce. Premade or packaged marinades. Premade or packaged taco seasonings. Relishes. Regular salad dressings.  Where to find more information:   National Heart, Lung, and Blood Institute: www.nhlbi.nih.gov   American Heart Association: www.heart.org  Summary   The DASH eating plan is a healthy eating plan that has been shown to reduce high blood pressure (hypertension). It may also reduce your risk for type 2 diabetes, heart disease, and stroke.   With the   DASH eating plan, you should limit salt (sodium) intake to 2,300 mg a day. If you have hypertension, you may need to reduce your sodium intake to 1,500 mg a day.   When on the DASH eating plan, aim to eat more fresh fruits and vegetables, whole grains, lean proteins, low-fat dairy, and heart-healthy fats.   Work with your health care provider or diet and nutrition specialist (dietitian) to adjust your eating plan to your individual calorie needs.  This information is not intended to replace advice given to you by your health care provider. Make sure you discuss any questions you have with your health care provider.  Document Released: 06/12/2011 Document Revised: 06/16/2016 Document Reviewed: 06/16/2016  Elsevier Interactive Patient Education  2019 Elsevier Inc.

## 2018-10-28 NOTE — Progress Notes (Signed)
Subjective:     Patient ID: Misty [name] , female    DOB: [birth year] , 53 y.o.   MRN: [id number]   Chief Complaint  Patient presents with  . Hypertension    HPI  Hypertension  This is a chronic problem. The current episode started more than 1 year ago. The problem has been gradually improving since onset. The problem is uncontrolled. Pertinent negatives include no blurred vision, chest pain, palpitations or shortness of breath. Risk factors for coronary artery disease include obesity and sedentary lifestyle. Past treatments include diuretics. The current treatment provides mild improvement.     Past Medical History:  Diagnosis Date  . Hypertension      Family History  Problem Relation Age of Onset  . Breast cancer Sister   . Osteoporosis Mother      Current Outpatient Medications:  .  Cholecalciferol (VITAMIN D3 PO), Take 1 tablet by mouth daily. 2,000 units daily, Disp: , Rfl:  .  Magnesium Hydroxide (MAGNESIA PO), Take 1 tablet by mouth daily., Disp: , Rfl:  .  mometasone (NASONEX) 50 MCG/ACT nasal spray, Place 2 sprays into the nose daily., Disp: 17 g, Rfl: 2 .  Multiple Vitamins-Minerals (CENTRUM ADULTS PO), Take 1 tablet by mouth daily., Disp: , Rfl:  .  Multiple Vitamins-Minerals (CENTRUM WOMEN PO), Take by mouth. 1 gummi per day, Disp: , Rfl:  .  olmesartan-hydrochlorothiazide (BENICAR HCT) 20-12.5 MG tablet, Take 1 tablet by mouth daily., Disp: 30 tablet, Rfl: 1 .  pneumococcal 13-valent conjugate vaccine (PREVNAR 13) SUSP injection, Inject 0.5 mLs into the muscle tomorrow at 10 am for 1 dose., Disp: 1 Syringe, Rfl: 0   No Known Allergies   Review of Systems  Constitutional: Negative.   Eyes: Negative for blurred vision.  Respiratory: Negative.  Negative for shortness of breath.   Cardiovascular: Negative.  Negative for chest pain and palpitations.  Gastrointestinal: Negative.   Neurological: Negative.   Psychiatric/Behavioral: Negative.      Today's  Vitals   10/28/18 1126  BP: (!) 130/96  Pulse: (!) 59  Temp: 98 F (36.7 C)  TempSrc: Oral  Weight: 216 lb 6.4 oz (98.2 kg)  Height: 5\' 3"  (1.6 m)   Body mass index is 38.33 kg/m.   Objective:  Physical Exam Vitals signs and nursing note reviewed.  Constitutional:      Appearance: Normal appearance.  HENT:     Head: Normocephalic and atraumatic.  Cardiovascular:     Rate and Rhythm: Normal rate and regular rhythm.     Heart sounds: Normal heart sounds.  Pulmonary:     Effort: Pulmonary effort is normal.     Breath sounds: Normal breath sounds.  Skin:    General: Skin is warm.  Neurological:     General: No focal deficit present.     Mental Status: She is alert.  Psychiatric:        Mood and Affect: Mood normal.        Behavior: Behavior normal.         Assessment And Plan:     1. Benign hypertension  Fair control. I will d/c Maxzide. I will switch her to olmesartan/hctz 20/12.5mg  once daily. She will rto in four weeks for re-evaluation. She is encouraged to avoid adding salt to her foods Importance of regular exercise was also discussed with the patient.   2. Other abnormal glucose  HER A1C HAS BEEN ELEVATED IN THE PAST. I WILL CHECK AN A1C, BMET AT HER NEXT  VISIT. PREVIOUS RESULTS WERE REVIEWED. SHE WAS ENCOURAGED TO AVOID SUGARY BEVERAGES AND PROCESSED FOODS INCLUDNG BREADS, RICE AND PASTA.  3. Need for vaccination  Rx [id number] was sent to her pharmacy. She is aware is a one-time injection.    4. Class 2 severe obesity due to excess calories with serious comorbidity and body mass index (BMI) of 38.0 to 38.9 in adult [facility])  Importance of achieving optimal weight to decrease risk of cardiovascular disease and cancers was discussed with the patient in full detail. She is encouraged to start slowly - start with 10 minutes twice daily at least three to four days per week and to gradually build to 30 minutes five days weekly. She was given tips to incorporate more  activity into her daily routine - take stairs when possible, park farther away from her job, grocery stores, etc.       Maximino Greenland, MD    THE PATIENT IS ENCOURAGED TO PRACTICE SOCIAL DISTANCING DUE TO THE COVID-19 PANDEMIC.

## 2018-11-30 ENCOUNTER — Ambulatory Visit: Payer: 59 | Admitting: Internal Medicine

## 2019-02-03 ENCOUNTER — Ambulatory Visit: Payer: 59 | Admitting: Internal Medicine

## 2019-02-03 ENCOUNTER — Encounter: Payer: Self-pay | Admitting: Internal Medicine

## 2019-02-03 ENCOUNTER — Other Ambulatory Visit: Payer: Self-pay

## 2019-02-03 VITALS — BP 124/80 | HR 67 | Temp 98.1°F | Ht 62.4 in | Wt 216.0 lb

## 2019-02-03 DIAGNOSIS — I10 Essential (primary) hypertension: Secondary | ICD-10-CM

## 2019-02-03 DIAGNOSIS — H6123 Impacted cerumen, bilateral: Secondary | ICD-10-CM

## 2019-02-03 DIAGNOSIS — Z0000 Encounter for general adult medical examination without abnormal findings: Secondary | ICD-10-CM | POA: Diagnosis not present

## 2019-02-03 DIAGNOSIS — E6609 Other obesity due to excess calories: Secondary | ICD-10-CM

## 2019-02-03 DIAGNOSIS — Z6839 Body mass index (BMI) 39.0-39.9, adult: Secondary | ICD-10-CM

## 2019-02-03 LAB — POCT URINALYSIS DIPSTICK
Bilirubin, UA: NEGATIVE
Blood, UA: NEGATIVE
Glucose, UA: NEGATIVE
Ketones, UA: NEGATIVE
Nitrite, UA: NEGATIVE
Protein, UA: NEGATIVE
Spec Grav, UA: 1.025 (ref 1.010–1.025)
Urobilinogen, UA: 0.2 E.U./dL
pH, UA: 6 (ref 5.0–8.0)

## 2019-02-03 LAB — POCT UA - MICROALBUMIN
Albumin/Creatinine Ratio, Urine, POC: <30
Creatinine, POC: 200 mg/dL
Microalbumin Ur, POC: 10 mg/L

## 2019-02-03 NOTE — Progress Notes (Signed)
Subjective:     Patient ID: Misty [name] , female    DOB: 1966/05/07 , 53 y.o.   MRN: [id number]   Chief Complaint  Patient presents with  . Annual Exam  . Hypertension    HPI  She is here today for a full physical examination. She is followed by Dr. Charlesetta Garibaldi for her GYN care. She has no specific concerns or complaints at this time. She did not tolerate olmesartan that was prescribed at her last visit. She is back to taking triamterene without any issues.  Hypertension This is a chronic problem. The current episode started more than 1 year ago. The problem has been gradually improving since onset. The problem is controlled. Pertinent negatives include no blurred vision, chest pain, headaches, palpitations or shortness of breath. Risk factors for coronary artery disease include obesity, post-menopausal state and sedentary lifestyle. Past treatments include diuretics and angiotensin blockers. The current treatment provides moderate improvement. Compliance problems include exercise.      Past Medical History:  Diagnosis Date  . Hypertension      Family History  Problem Relation Age of Onset  . Osteoporosis Mother   . Breast cancer Sister      Current Outpatient Medications:  .  cetirizine (ZYRTEC) 10 MG tablet, Take 10 mg by mouth daily., Disp: , Rfl:  .  Cholecalciferol (VITAMIN D3 PO), Take 1 tablet by mouth daily. 2,000 units daily, Disp: , Rfl:  .  Magnesium Hydroxide (MAGNESIA PO), Take 1 tablet by mouth daily., Disp: , Rfl:  .  Multiple Vitamins-Minerals (CENTRUM WOMEN PO), Take by mouth. 1 gummi per day, Disp: , Rfl:  .  triamterene-hydrochlorothiazide (DYAZIDE) 37.5-25 MG capsule, triamterene 37.5 mg-hydrochlorothiazide 25 mg capsule  TAKE 1 CAPSULE BY MOUTH EVERY DAY, Disp: , Rfl:  .  mometasone (NASONEX) 50 MCG/ACT nasal spray, Place 2 sprays into the nose daily. (Patient not taking: Reported on 02/03/2019), Disp: 17 g, Rfl: 2   No Known Allergies   Review of  Systems  Constitutional: Negative.   HENT: Negative.   Eyes: Negative.  Negative for blurred vision.  Respiratory: Negative.  Negative for shortness of breath.   Cardiovascular: Negative.  Negative for chest pain and palpitations.  Endocrine: Negative.   Genitourinary: Negative.   Musculoskeletal: Negative.   Skin: Negative.   Allergic/Immunologic: Negative.   Neurological: Negative.  Negative for headaches.  Hematological: Negative.   Psychiatric/Behavioral: Negative.      Today's Vitals   02/03/19 1004  BP: 124/80  Pulse: 67  Temp: 98.1 F (36.7 C)  TempSrc: Oral  Weight: 216 lb (98 kg)  Height: 5' 2.4" (1.585 m)   Body mass index is 39 kg/m.   Objective:  Physical Exam Vitals signs and nursing note reviewed.  Constitutional:      Appearance: Normal appearance. She is obese.  HENT:     Head: Normocephalic and atraumatic.     Right Ear: Ear canal and external ear normal. There is impacted cerumen.     Left Ear: Ear canal and external ear normal. There is impacted cerumen.     Ears:     Comments: Hard, impacted cerumen noted b/l. TM not visualized.     Nose: Nose normal.     Mouth/Throat:     Mouth: Mucous membranes are moist.     Pharynx: Oropharynx is clear.  Eyes:     Extraocular Movements: Extraocular movements intact.     Conjunctiva/sclera: Conjunctivae normal.     Pupils: Pupils are equal,  round, and reactive to light.  Neck:     Musculoskeletal: Normal range of motion and neck supple.  Cardiovascular:     Rate and Rhythm: Normal rate and regular rhythm.     Pulses: Normal pulses.     Heart sounds: Normal heart sounds.  Pulmonary:     Effort: Pulmonary effort is normal.     Breath sounds: Normal breath sounds.  Abdominal:     General: Bowel sounds are normal.     Palpations: Abdomen is soft.     Comments: Obese, difficult to assess organomegaly.   Genitourinary:    Comments: deferred Musculoskeletal: Normal range of motion.  Skin:    General:  Skin is warm and dry.  Neurological:     General: No focal deficit present.     Mental Status: She is alert and oriented to person, place, and time.  Psychiatric:        Mood and Affect: Mood normal.        Behavior: Behavior normal.         Assessment And Plan:     1. Routine general medical examination at health care facility  A full exam was performed.  Importance of monthly self breast exams was discussed with the patient.  PATIENT HAS BEEN ADVISED TO GET 30-45 MINUTES REGULAR EXERCISE NO LESS THAN FOUR TO FIVE DAYS PER WEEK - BOTH WEIGHTBEARING EXERCISES AND AEROBIC ARE RECOMMENDED.  SHE IS ADVISED TO FOLLOW A HEALTHY DIET WITH AT LEAST SIX FRUITS/VEGGIES PER DAY, DECREASE INTAKE OF RED MEAT, AND TO INCREASE FISH INTAKE TO TWO DAYS PER WEEK.  MEATS/FISH SHOULD NOT BE FRIED, BAKED OR BROILED IS PREFERABLE.  I SUGGEST WEARING SPF 50 SUNSCREEN ON EXPOSED PARTS AND ESPECIALLY WHEN IN THE DIRECT SUNLIGHT FOR AN EXTENDED PERIOD OF TIME.  PLEASE AVOID FAST FOOD RESTAURANTS AND INCREASE YOUR WATER INTAKE.  - CMP14+EGFR - CBC - Lipid panel - Hemoglobin A1c - POCT Urinalysis Dipstick (81002) - POCT UA - Microalbumin  2. Benign hypertension  Chronic, well controlled. She will continue with current meds. She is encouraged to avoid adding salt to her foods. EKG performed, SB w/o acute changes. She will rto in six months for re-evaluation. At that time, we will revisit starting a new medication if needed. She will strive to lose 20 pounds by this time.   - EKG 12-Lead - POCT Urinalysis Dipstick (81002) - POCT UA - Microalbumin  3. Impacted cerumen of both ears  AFTER OBTAINING VERBAL CONSENT, BOTH EARS WERE FLUSHED BY IRRIGATION. SHE TOLERATED PROCEDURE WELL WITHOUT ANY COMPLICATIONS. NO TM ABNORMALITIES WERE NOTED.  - Ear Lavage  4. Class 2 obesity due to excess calories without serious comorbidity with body mass index (BMI) of 39.0 to 39.9 in adult  Importance of achieving optimal  weight to decrease risk of cardiovascular disease and cancers was discussed with the patient in full detail. She is encouraged to start slowly - start with 10 minutes twice daily at least three to four days per week and to gradually build to 30 minutes five days weekly. She was given tips to incorporate more activity into her daily routine - take stairs when possible, park farther away from her job, grocery stores, etc.     Maximino Greenland, MD    THE PATIENT IS ENCOURAGED TO PRACTICE SOCIAL DISTANCING DUE TO THE COVID-19 PANDEMIC.

## 2019-02-03 NOTE — Patient Instructions (Signed)
Health Maintenance, Female Adopting a healthy lifestyle and getting preventive care are important in promoting health and wellness. Ask your health care provider about:  The right schedule for you to have regular tests and exams.  Things you can do on your own to prevent diseases and keep yourself healthy. What should I know about diet, weight, and exercise? Eat a healthy diet   Eat a diet that includes plenty of vegetables, fruits, low-fat dairy products, and lean protein.  Do not eat a lot of foods that are high in solid fats, added sugars, or sodium. Maintain a healthy weight Body mass index (BMI) is used to identify weight problems. It estimates body fat based on height and weight. Your health care provider can help determine your BMI and help you achieve or maintain a healthy weight. Get regular exercise Get regular exercise. This is one of the most important things you can do for your health. Most adults should:  Exercise for at least 150 minutes each week. The exercise should increase your heart rate and make you sweat (moderate-intensity exercise).  Do strengthening exercises at least twice a week. This is in addition to the moderate-intensity exercise.  Spend less time sitting. Even light physical activity can be beneficial. Watch cholesterol and blood lipids Have your blood tested for lipids and cholesterol at 54 years of age, then have this test every 5 years. Have your cholesterol levels checked more often if:  Your lipid or cholesterol levels are high.  You are older than 54 years of age.  You are at high risk for heart disease. What should I know about cancer screening? Depending on your health history and family history, you may need to have cancer screening at various ages. This may include screening for:  Breast cancer.  Cervical cancer.  Colorectal cancer.  Skin cancer.  Lung cancer. What should I know about heart disease, diabetes, and high blood  pressure? Blood pressure and heart disease  High blood pressure causes heart disease and increases the risk of stroke. This is more likely to develop in people who have high blood pressure readings, are of African descent, or are overweight.  Have your blood pressure checked: ? Every 3-5 years if you are 18-39 years of age. ? Every year if you are 40 years old or older. Diabetes Have regular diabetes screenings. This checks your fasting blood sugar level. Have the screening done:  Once every three years after age 40 if you are at a normal weight and have a low risk for diabetes.  More often and at a younger age if you are overweight or have a high risk for diabetes. What should I know about preventing infection? Hepatitis B If you have a higher risk for hepatitis B, you should be screened for this virus. Talk with your health care provider to find out if you are at risk for hepatitis B infection. Hepatitis C Testing is recommended for:  Everyone born from 1945 through 1965.  Anyone with known risk factors for hepatitis C. Sexually transmitted infections (STIs)  Get screened for STIs, including gonorrhea and chlamydia, if: ? You are sexually active and are younger than 54 years of age. ? You are older than 54 years of age and your health care provider tells you that you are at risk for this type of infection. ? Your sexual activity has changed since you were last screened, and you are at increased risk for chlamydia or gonorrhea. Ask your health care provider if   you are at risk.  Ask your health care provider about whether you are at high risk for HIV. Your health care provider may recommend a prescription medicine to help prevent HIV infection. If you choose to take medicine to prevent HIV, you should first get tested for HIV. You should then be tested every 3 months for as long as you are taking the medicine. Pregnancy  If you are about to stop having your period (premenopausal) and  you may become pregnant, seek counseling before you get pregnant.  Take 400 to 800 micrograms (mcg) of folic acid every day if you become pregnant.  Ask for birth control (contraception) if you want to prevent pregnancy. Osteoporosis and menopause Osteoporosis is a disease in which the bones lose minerals and strength with aging. This can result in bone fractures. If you are 65 years old or older, or if you are at risk for osteoporosis and fractures, ask your health care provider if you should:  Be screened for bone loss.  Take a calcium or vitamin D supplement to lower your risk of fractures.  Be given hormone replacement therapy (HRT) to treat symptoms of menopause. Follow these instructions at home: Lifestyle  Do not use any products that contain nicotine or tobacco, such as cigarettes, e-cigarettes, and chewing tobacco. If you need help quitting, ask your health care provider.  Do not use street drugs.  Do not share needles.  Ask your health care provider for help if you need support or information about quitting drugs. Alcohol use  Do not drink alcohol if: ? Your health care provider tells you not to drink. ? You are pregnant, may be pregnant, or are planning to become pregnant.  If you drink alcohol: ? Limit how much you use to 0-1 drink a day. ? Limit intake if you are breastfeeding.  Be aware of how much alcohol is in your drink. In the [place] one drink equals one 12 oz bottle of beer (355 mL), one 5 oz glass of wine (148 mL), or one 1 oz glass of hard liquor (44 mL). General instructions  Schedule regular health, dental, and eye exams.  Stay current with your vaccines.  Tell your health care provider if: ? You often feel depressed. ? You have ever been abused or do not feel safe at home. Summary  Adopting a healthy lifestyle and getting preventive care are important in promoting health and wellness.  Follow your health care provider's instructions about healthy  diet, exercising, and getting tested or screened for diseases.  Follow your health care provider's instructions on monitoring your cholesterol and blood pressure. This information is not intended to replace advice given to you by your health care provider. Make sure you discuss any questions you have with your health care provider. Document Released: 01/06/2011 Document Revised: 06/16/2018 Document Reviewed: 06/16/2018 Elsevier Patient Education  2020 [employer]  

## 2019-02-04 LAB — CMP14+EGFR
ALT: 13 IU/L (ref 0–32)
AST: 20 IU/L (ref 0–40)
Albumin/Globulin Ratio: 1.5 (ref 1.2–2.2)
Albumin: 4.3 g/dL (ref 3.8–4.9)
Alkaline Phosphatase: 82 IU/L (ref 39–117)
BUN/Creatinine Ratio: 11 (ref 9–23)
BUN: 11 mg/dL (ref 6–24)
Bilirubin Total: 0.5 mg/dL (ref 0.0–1.2)
CO2: 25 mmol/L (ref 20–29)
Calcium: 9.6 mg/dL (ref 8.7–10.2)
Chloride: 101 mmol/L (ref 96–106)
Creatinine, Ser: 1 mg/dL (ref 0.57–1.00)
GFR calc Af Amer: 74 mL/min/{1.73_m2} (ref >59)
GFR calc non Af Amer: 64 mL/min/{1.73_m2} (ref >59)
Globulin, Total: 2.9 g/dL (ref 1.5–4.5)
Glucose: 98 mg/dL (ref 65–99)
Potassium: 4.1 mmol/L (ref 3.5–5.2)
Sodium: 140 mmol/L (ref 134–144)
Total Protein: 7.2 g/dL (ref 6.0–8.5)

## 2019-02-04 LAB — LIPID PANEL
Chol/HDL Ratio: 3 ratio (ref 0.0–4.4)
Cholesterol, Total: 180 mg/dL (ref 100–199)
HDL: 61 mg/dL (ref >39)
LDL Calculated: 104 mg/dL — ABNORMAL HIGH (ref 0–99)
Triglycerides: 73 mg/dL (ref 0–149)
VLDL Cholesterol Cal: 15 mg/dL (ref 5–40)

## 2019-02-04 LAB — CBC
Hematocrit: 39.6 % (ref 34.0–46.6)
Hemoglobin: 12.8 g/dL (ref 11.1–15.9)
MCH: 28.6 pg (ref 26.6–33.0)
MCHC: 32.3 g/dL (ref 31.5–35.7)
MCV: 89 fL (ref 79–97)
Platelets: 295 10*3/uL (ref 150–450)
RBC: 4.47 x10E6/uL (ref 3.77–5.28)
RDW: 13 % (ref 11.7–15.4)
WBC: 4.8 10*3/uL (ref 3.4–10.8)

## 2019-02-04 LAB — HEMOGLOBIN A1C
Est. average glucose Bld gHb Est-mCnc: 117 mg/dL
Hgb A1c MFr Bld: 5.7 % — ABNORMAL HIGH (ref 4.8–5.6)

## 2019-07-26 ENCOUNTER — Other Ambulatory Visit: Payer: Self-pay | Admitting: Internal Medicine

## 2019-08-08 ENCOUNTER — Ambulatory Visit: Payer: 59 | Admitting: Internal Medicine

## 2019-08-30 ENCOUNTER — Encounter: Payer: Self-pay | Admitting: Internal Medicine

## 2019-08-30 ENCOUNTER — Other Ambulatory Visit: Payer: Self-pay

## 2019-08-30 ENCOUNTER — Ambulatory Visit: Payer: 59 | Admitting: Internal Medicine

## 2019-08-30 VITALS — BP 154/92 | HR 70 | Temp 98.3°F | Ht 62.4 in | Wt 225.2 lb

## 2019-08-30 DIAGNOSIS — L989 Disorder of the skin and subcutaneous tissue, unspecified: Secondary | ICD-10-CM | POA: Diagnosis not present

## 2019-08-30 DIAGNOSIS — Z6841 Body Mass Index (BMI) 40.0 and over, adult: Secondary | ICD-10-CM

## 2019-08-30 DIAGNOSIS — R7309 Other abnormal glucose: Secondary | ICD-10-CM | POA: Diagnosis not present

## 2019-08-30 DIAGNOSIS — I10 Essential (primary) hypertension: Secondary | ICD-10-CM

## 2019-08-30 DIAGNOSIS — L03316 Cellulitis of umbilicus: Secondary | ICD-10-CM | POA: Diagnosis not present

## 2019-08-30 MED ORDER — DOXYCYCLINE HYCLATE 100 MG PO CAPS: 100.0000 mg | ORAL_CAPSULE | Freq: Two times a day (BID) | ORAL | 0 refills | Status: DC

## 2019-08-30 NOTE — Patient Instructions (Signed)

## 2019-08-31 LAB — BMP8+EGFR
BUN/Creatinine Ratio: 11 (ref 9–23)
BUN: 10 mg/dL (ref 6–24)
CO2: 25 mmol/L (ref 20–29)
Calcium: 9.5 mg/dL (ref 8.7–10.2)
Chloride: 100 mmol/L (ref 96–106)
Creatinine, Ser: 0.93 mg/dL (ref 0.57–1.00)
GFR calc Af Amer: 81 mL/min/{1.73_m2} (ref >59)
GFR calc non Af Amer: 70 mL/min/{1.73_m2} (ref >59)
Glucose: 85 mg/dL (ref 65–99)
Potassium: 4.2 mmol/L (ref 3.5–5.2)
Sodium: 140 mmol/L (ref 134–144)

## 2019-08-31 LAB — HEMOGLOBIN A1C
Est. average glucose Bld gHb Est-mCnc: 117 mg/dL
Hgb A1c MFr Bld: 5.7 % — ABNORMAL HIGH (ref 4.8–5.6)

## 2019-09-01 ENCOUNTER — Encounter: Payer: Self-pay | Admitting: Internal Medicine

## 2019-09-03 NOTE — Progress Notes (Signed)
This visit occurred during the SARS-CoV-2 public health emergency.  Safety protocols were in place, including screening questions prior to the visit, additional usage of staff PPE, and extensive cleaning of exam room while observing appropriate contact time as indicated for disinfecting solutions.  Subjective:     Patient ID: Misty [name] , female    DOB: 11-Sep-1965 , 54 y.o.   MRN: [id number]   Chief Complaint  Patient presents with  . rash    HPI  She is here today for further evaluation of a rash in her belly button.  She reports seeing brown drainage from her navel when taking a shower. She denies trauma to this area. She denies fever/chills. She does not have a piercing.     Past Medical History:  Diagnosis Date  . Hypertension      Family History  Problem Relation Age of Onset  . Osteoporosis Mother   . Breast cancer Sister      Current Outpatient Medications:  .  cetirizine (ZYRTEC) 10 MG tablet, Take 10 mg by mouth daily., Disp: , Rfl:  .  Cholecalciferol (VITAMIN D3 PO), Take 1 tablet by mouth daily. 2,000 units daily, Disp: , Rfl:  .  Magnesium Hydroxide (MAGNESIA PO), Take 1 tablet by mouth daily., Disp: , Rfl:  .  mometasone (NASONEX) 50 MCG/ACT nasal spray, Place 2 sprays into the nose daily., Disp: 17 g, Rfl: 2 .  Multiple Vitamins-Minerals (CENTRUM WOMEN PO), Take by mouth. 1 gummi per day, Disp: , Rfl:  .  triamterene-hydrochlorothiazide (DYAZIDE) 37.5-25 MG capsule, TAKE 1 CAPSULE BY MOUTH EVERY DAY, Disp: 30 capsule, Rfl: 4 .  doxycycline (VIBRAMYCIN) 100 MG capsule, Take 1 capsule (100 mg total) by mouth 2 (two) times daily., Disp: 20 capsule, Rfl: 0   No Known Allergies   Review of Systems  Constitutional: Negative.   Respiratory: Negative.   Cardiovascular: Negative.   Gastrointestinal: Negative.   Skin: Positive for rash.  Neurological: Negative.   Psychiatric/Behavioral: Negative.      Today's Vitals   08/30/19 1603 08/30/19 1620  BP:  (!) 174/96 (!) 154/92  Pulse: 70   Temp: 98.3 F (36.8 C)   TempSrc: Oral   Weight: 225 lb 3.2 oz (102.2 kg)   Height: 5' 2.4" (1.585 m)    Body mass index is 40.66 kg/m.   Objective:  Physical Exam Vitals and nursing note reviewed.  Constitutional:      Appearance: Normal appearance.  HENT:     Head: Normocephalic and atraumatic.  Cardiovascular:     Rate and Rhythm: Normal rate and regular rhythm.     Heart sounds: Normal heart sounds.  Pulmonary:     Effort: Pulmonary effort is normal.     Breath sounds: Normal breath sounds.  Skin:    General: Skin is warm.     Comments: Hyperpigmented skin lesion in navel. Umbilicus is erythematous. No drainage noted. Pigmented skin lesion in umbilicus.   Neurological:     General: No focal deficit present.     Mental Status: She is alert.  Psychiatric:        Mood and Affect: Mood normal.        Behavior: Behavior normal.         Assessment And Plan:     1. Cellulitis of umbilicus  She was given rx doxycycline and encouraged to take the full course of abx.   - Ambulatory referral to Dermatology  2. Skin lesion  I will refer her  to Derm for excision.   3. Essential hypertension  Chronic, uncontrolled. Repeat BP was improved. Importance of medication compliance was discussed with the patient. She agrees to rto in 1-2 weeks for a nurse visit. I will adjust meds at that time if needed. I will check renal function today.   - BMP8+EGFR  4. Other abnormal glucose  HER A1C HAS BEEN ELEVATED IN THE PAST. I WILL CHECK AN A1C, BMET TODAY. SHE WAS ENCOURAGED TO AVOID SUGARY BEVERAGES AND PROCESSED FOODS INCLUDNG BREADS, RICE AND PASTA.  - Hemoglobin A1c  5. Class 3 severe obesity due to excess calories with serious comorbidity and body mass index (BMI) of 40.0 to 44.9 in adult (HCC)  BMI 40. She is encouraged to strive for BMI less than 35 to decrease cardiac risk. She is encouraged to exercise 30 minutes five days weekly.     Maximino Greenland, MD    THE PATIENT IS ENCOURAGED TO PRACTICE SOCIAL DISTANCING DUE TO THE COVID-19 PANDEMIC.

## 2019-09-20 ENCOUNTER — Ambulatory Visit: Payer: 59

## 2019-09-23 ENCOUNTER — Encounter: Payer: Self-pay | Admitting: Internal Medicine

## 2019-09-26 ENCOUNTER — Telehealth: Payer: Self-pay

## 2019-09-26 NOTE — Telephone Encounter (Signed)
PT LVM TO RESCHEDULE NV FROM 3/16. ATT TO CONTACT PT TO RESCHEDULE NO ANS LVM FOR PT TO CALL OFC.

## 2019-09-27 ENCOUNTER — Other Ambulatory Visit: Payer: Self-pay

## 2019-09-27 ENCOUNTER — Ambulatory Visit: Payer: 59

## 2019-09-27 VITALS — BP 132/86 | HR 63 | Temp 98.2°F | Ht 62.0 in | Wt 224.8 lb

## 2019-09-27 DIAGNOSIS — I10 Essential (primary) hypertension: Secondary | ICD-10-CM

## 2019-09-27 NOTE — Progress Notes (Signed)
Patient presents in office today for BP check. Patient has been drinking 2 16oz bottles of water a day , patient is woring on decreasing fatty/fried foods. BP is 132/86 Pulse is 63. Patient will return in 83mo.

## 2019-12-19 ENCOUNTER — Other Ambulatory Visit: Payer: Self-pay

## 2019-12-19 ENCOUNTER — Ambulatory Visit: Payer: 59 | Admitting: Internal Medicine

## 2019-12-19 ENCOUNTER — Encounter: Payer: Self-pay | Admitting: Internal Medicine

## 2019-12-19 VITALS — BP 138/92 | HR 62 | Temp 97.9°F | Ht 62.0 in | Wt 227.4 lb

## 2019-12-19 DIAGNOSIS — M25562 Pain in left knee: Secondary | ICD-10-CM | POA: Diagnosis not present

## 2019-12-19 DIAGNOSIS — I10 Essential (primary) hypertension: Secondary | ICD-10-CM

## 2019-12-19 DIAGNOSIS — Z6841 Body Mass Index (BMI) 40.0 and over, adult: Secondary | ICD-10-CM

## 2019-12-19 NOTE — Progress Notes (Signed)
This visit occurred during the SARS-CoV-2 public health emergency.  Safety protocols were in place, including screening questions prior to the visit, additional usage of staff PPE, and extensive cleaning of exam room while observing appropriate contact time as indicated for disinfecting solutions.  Subjective:     Patient ID: Misty [name] , female    DOB: [birth year] , 54 y.o.   MRN: [id number]   Chief Complaint  Patient presents with  . Knee Pain    left    HPI  She presents today for further evaluation of left knee pain. Sx started within the past week. She denies fall/trauma. There is pain with ambulation, especially when going up/down steps. She does have stiffness upon awakening, improves as the day progresses.   Knee Pain  The incident occurred more than 1 week ago. There was no injury mechanism. The pain is present in the left knee. The quality of the pain is described as aching. The pain is at a severity of 3/10. The pain is mild. The pain has been fluctuating since onset. Pertinent negatives include no numbness or tingling. She reports no foreign bodies present. The symptoms are aggravated by movement and weight bearing. She has tried acetaminophen and NSAIDs for the symptoms. The treatment provided mild relief.     Past Medical History:  Diagnosis Date  . Hypertension      Family History  Problem Relation Age of Onset  . Osteoporosis Mother   . Breast cancer Sister      Current Outpatient Medications:  .  cetirizine (ZYRTEC) 10 MG tablet, Take 10 mg by mouth daily., Disp: , Rfl:  .  Cholecalciferol (VITAMIN D3 PO), Take 1 tablet by mouth daily. 2,000 units daily, Disp: , Rfl:  .  Magnesium Hydroxide (MAGNESIA PO), Take 1 tablet by mouth daily., Disp: , Rfl:  .  Multiple Vitamins-Minerals (CENTRUM WOMEN PO), Take by mouth. 1 gummi per day, Disp: , Rfl:  .  triamterene-hydrochlorothiazide (DYAZIDE) 37.5-25 MG capsule, TAKE 1 CAPSULE BY MOUTH EVERY DAY, Disp: 30  capsule, Rfl: 4 .  mometasone (NASONEX) 50 MCG/ACT nasal spray, Place 2 sprays into the nose daily., Disp: 17 g, Rfl: 2   No Known Allergies   Review of Systems  Constitutional: Negative.   Respiratory: Negative.   Cardiovascular: Negative.   Gastrointestinal: Negative.   Musculoskeletal: Positive for arthralgias.  Neurological: Negative.  Negative for tingling and numbness.  Psychiatric/Behavioral: Negative.      Today's Vitals   12/19/19 1108  BP: (!) 138/92  Pulse: 62  Temp: 97.9 F (36.6 C)  TempSrc: Oral  Weight: 227 lb 6.4 oz (103.1 kg)  Height: 5\' 2"  (1.575 m)  PainSc: 3   PainLoc: Knee   Body mass index is 41.59 kg/m.   Objective:  Physical Exam Vitals and nursing note reviewed.  Constitutional:      Appearance: Normal appearance. She is obese.  HENT:     Head: Normocephalic and atraumatic.  Cardiovascular:     Rate and Rhythm: Normal rate and regular rhythm.     Heart sounds: Normal heart sounds.  Pulmonary:     Effort: Pulmonary effort is normal.     Breath sounds: Normal breath sounds.  Musculoskeletal:     Comments: Some crepitus in left knee. There is also swelling. No overlying erythema. There is tenderness inferior to patella.   Skin:    General: Skin is warm.  Neurological:     General: No focal deficit present.  Mental Status: She is alert.  Psychiatric:        Mood and Affect: Mood normal.        Behavior: Behavior normal.         Assessment And Plan:     1. Acute pain of left knee  I will refer her for radiographic evaluation/sports med eval. She is in agreement with referral. She was advised to purchase OTC Voltaren gel to apply to affected area three to four times daily as needed. Encouraged to cut back on her use of oral ibuprofen.   - Ambulatory referral to Sports Medicine  2. Benign hypertension  Chronic, fair control. Possibly exacerbated by pain and increased use of NSAIDs. I will re-evaluate in 3-4 months.   3. Class 3  severe obesity due to excess calories with serious comorbidity and body mass index (BMI) of 40.0 to 44.9 in adult (HCC)  WE DISCUSSED THE USE OF WEIGHT LOSS MEDS TO HELP WITH OBESITY. SHE DENIES FAMILY HISTORY OF THYROID CANCER. SHE WAS INSTRUCTED ON HOW TO SELF ADMINISTER THE MEDICATION. SHE WILL START WITH 0.6MG  DAILY AND INCREASE HER DOSE BY 5 CLICKS ONCE WEEKLY. SHE WAS REMINDED OF POSSIBLE SIDE EFFECTS INCLUDING NAUSEA, HEADACHES AND DIZZINESS.  SHE WAS ADVISED TO STOP EATING WHEN SHE BEGINS TO FEEL FULL.  SHE WILL RTO IN 4-6 WEEKS FOR RE-EVALUATION.   Maximino Greenland, MD    THE PATIENT IS ENCOURAGED TO PRACTICE SOCIAL DISTANCING DUE TO THE COVID-19 PANDEMIC.

## 2019-12-19 NOTE — Patient Instructions (Signed)
Voltaren gel - apply to affected area two to three times daily as needed.   Acute Knee Pain, Adult Many things can cause knee pain. Sometimes, knee pain is sudden (acute) and may be caused by damage, swelling, or irritation of the muscles and tissues that support your knee. The pain often goes away on its own with time and rest. If the pain does not go away, tests may be done to find out what is causing the pain. Follow these instructions at home: Pay attention to any changes in your symptoms. Take these actions to relieve your pain. If you have a knee sleeve or brace:   Wear the sleeve or brace as told by your doctor. Remove it only as told by your doctor.  Loosen the sleeve or brace if your toes: ? Tingle. ? Become numb. ? Turn cold and blue.  Keep the sleeve or brace clean.  If the sleeve or brace is not waterproof: ? Do not let it get wet. ? Cover it with a watertight covering when you take a bath or shower. Activity  Rest your knee.  Do not do things that cause pain.  Avoid activities where both feet leave the ground at the same time (high-impact activities). Examples are running, jumping rope, and doing jumping jacks.  Work with a physical therapist to make a safe exercise program, as told by your doctor. Managing pain, stiffness, and swelling   If told, put ice on the knee: ? Put ice in a plastic bag. ? Place a towel between your skin and the bag. ? Leave the ice on for 20 minutes, 2-3 times a day.  If told, put pressure (compression) on your injured knee to control swelling, give support, and help with discomfort. Compression may be done with an elastic bandage. General instructions  Take all medicines only as told by your doctor.  Raise (elevate) your knee while you are sitting or lying down. Make sure your knee is higher than your heart.  Sleep with a pillow under your knee.  Do not use any products that contain nicotine or tobacco. These include  cigarettes, e-cigarettes, and chewing tobacco. These products may slow down healing. If you need help quitting, ask your doctor.  If you are overweight, work with your doctor and a food expert (dietitian) to set goals to lose weight. Being overweight can make your knee hurt more.  Keep all follow-up visits as told by your doctor. This is important. Contact a doctor if:  The knee pain does not stop.  The knee pain changes or gets worse.  You have a fever along with knee pain.  Your knee feels warm when you touch it.  Your knee gives out or locks up. Get help right away if:  Your knee swells, and the swelling gets worse.  You cannot move your knee.  You have very bad knee pain. Summary  Many things can cause knee pain. The pain often goes away on its own with time and rest.  Your doctor may do tests to find out the cause of the pain.  Pay attention to any changes in your symptoms. Relieve your pain with rest, medicines, light activity, and use of ice.  Get help right away if you cannot move your knee or your knee pain is very bad. This information is not intended to replace advice given to you by your health care provider. Make sure you discuss any questions you have with your health care provider. Document Revised:  12/03/2017 Document Reviewed: 12/03/2017 Elsevier Patient Education  [place].

## 2020-01-26 LAB — HM DEXA SCAN

## 2020-02-14 ENCOUNTER — Encounter: Payer: 59 | Admitting: Internal Medicine

## 2020-02-23 ENCOUNTER — Encounter: Payer: Self-pay | Admitting: Internal Medicine

## 2020-02-23 ENCOUNTER — Other Ambulatory Visit: Payer: Self-pay

## 2020-02-23 ENCOUNTER — Ambulatory Visit: Payer: 59 | Admitting: Internal Medicine

## 2020-02-23 VITALS — BP 144/92 | HR 65 | Temp 97.4°F | Ht 62.2 in | Wt 217.4 lb

## 2020-02-23 DIAGNOSIS — I10 Essential (primary) hypertension: Secondary | ICD-10-CM | POA: Diagnosis not present

## 2020-02-23 DIAGNOSIS — L03031 Cellulitis of right toe: Secondary | ICD-10-CM

## 2020-02-23 DIAGNOSIS — Z0000 Encounter for general adult medical examination without abnormal findings: Secondary | ICD-10-CM

## 2020-02-23 DIAGNOSIS — Z6839 Body mass index (BMI) 39.0-39.9, adult: Secondary | ICD-10-CM

## 2020-02-23 LAB — POCT URINALYSIS DIPSTICK
Bilirubin, UA: NEGATIVE
Blood, UA: NEGATIVE
Glucose, UA: NEGATIVE
Ketones, UA: NEGATIVE
Leukocytes, UA: NEGATIVE
Nitrite, UA: NEGATIVE
Protein, UA: NEGATIVE
Spec Grav, UA: 1.02 (ref 1.010–1.025)
Urobilinogen, UA: 0.2 E.U./dL
pH, UA: 6 (ref 5.0–8.0)

## 2020-02-23 LAB — POCT UA - MICROALBUMIN
Albumin/Creatinine Ratio, Urine, POC: <30
Creatinine, POC: 200 mg/dL
Microalbumin Ur, POC: 10 mg/L

## 2020-02-23 MED ORDER — WEGOVY 0.5 MG/0.5ML ~~LOC~~ SOAJ: 0.5000 mg | SUBCUTANEOUS | 0 refills | Status: DC

## 2020-02-23 NOTE — Progress Notes (Signed)
I,Katawbba Wiggins,acting as a Education administrator for Maximino Greenland, MD.,have documented all relevant documentation on the behalf of Maximino Greenland, MD,as directed by  Maximino Greenland, MD while in the presence of Maximino Greenland, MD.  This visit occurred during the SARS-CoV-2 public health emergency.  Safety protocols were in place, including screening questions prior to the visit, additional usage of staff PPE, and extensive cleaning of exam room while observing appropriate contact time as indicated for disinfecting solutions.  Subjective:     Patient ID: Misty [name] , female    DOB: [birth year] , 54 y.o.   MRN: [id number]   Chief Complaint  Patient presents with  . Annual Exam  . Hypertension    HPI  She is here today for a full physical examination. She is followed by Dr. Charlesetta Garibaldi for her GYN care.  She didn't take her bp med this morning because she "took it too late yesterday".  Hypertension This is a chronic problem. The current episode started more than 1 year ago. The problem has been gradually improving since onset. The problem is controlled. Pertinent negatives include no blurred vision, chest pain, headaches, palpitations or shortness of breath. Risk factors for coronary artery disease include obesity, post-menopausal state and sedentary lifestyle. Past treatments include diuretics and angiotensin blockers. The current treatment provides moderate improvement. Compliance problems include exercise.      Past Medical History:  Diagnosis Date  . Hypertension      Family History  Problem Relation Age of Onset  . Osteoporosis Mother   . Breast cancer Sister      Current Outpatient Medications:  .  cetirizine (ZYRTEC) 10 MG tablet, Take 10 mg by mouth daily., Disp: , Rfl:  .  Cholecalciferol (VITAMIN D3 PO), Take 1 tablet by mouth daily. 2,000 units daily, Disp: , Rfl:  .  Magnesium Hydroxide (MAGNESIA PO), Take 1 tablet by mouth daily., Disp: , Rfl:  .  Multiple  Vitamins-Minerals (CENTRUM SILVER 50+WOMEN PO), Take by mouth., Disp: , Rfl:  .  triamterene-hydrochlorothiazide (DYAZIDE) 37.5-25 MG capsule, TAKE 1 CAPSULE BY MOUTH EVERY DAY, Disp: 30 capsule, Rfl: 4 .  cephALEXin (KEFLEX) 500 MG capsule, Take 1 capsule (500 mg total) by mouth 3 (three) times daily., Disp: 21 capsule, Rfl: 0 .  MELOXICAM PO, Take by mouth., Disp: , Rfl:  .  mometasone (NASONEX) 50 MCG/ACT nasal spray, Place 2 sprays into the nose daily., Disp: 17 g, Rfl: 2 .  Semaglutide-Weight Management (WEGOVY) 0.5 MG/0.5ML SOAJ, Inject 0.5 mg into the skin once a week., Disp: 2 mL, Rfl: 0 .  Vitamin D, Ergocalciferol, (DRISDOL) 1.25 MG (50000 UNIT) CAPS capsule, One capsule po twice weekly on Tuesdays/Fridays, Disp: 24 capsule, Rfl: 1   No Known Allergies    The patient states she uses none for birth control. Last LMP was No LMP recorded. Patient is postmenopausal.. Negative for Dysmenorrhea. Negative for: breast discharge, breast lump(s), breast pain and breast self exam. Associated symptoms include abnormal vaginal bleeding. Pertinent negatives include abnormal bleeding (hematology), anxiety, decreased libido, depression, difficulty falling sleep, dyspareunia, history of infertility, nocturia, sexual dysfunction, sleep disturbances, urinary incontinence, urinary urgency, vaginal discharge and vaginal itching. Diet regular.The patient states her exercise level is  intermittent.   . The patient's tobacco use is:  Social History   Tobacco Use  Smoking Status Never Smoker  Smokeless Tobacco Never Used  . She has been exposed to passive smoke. The patient's alcohol use is:  Social History  Substance and Sexual Activity  Alcohol Use No     Review of Systems  Constitutional: Negative.   HENT: Negative.   Eyes: Negative.  Negative for blurred vision.  Respiratory: Negative.  Negative for shortness of breath.   Cardiovascular: Negative.  Negative for chest pain and palpitations.   Gastrointestinal: Negative.   Endocrine: Negative.   Musculoskeletal: Positive for joint swelling (great right foot).  Skin: Negative.   Allergic/Immunologic: Negative.   Neurological: Negative.  Negative for headaches.  Hematological: Negative.   Psychiatric/Behavioral: Negative.      Today's Vitals   02/23/20 1109  BP: (!) 144/92  Pulse: 65  Temp: (!) 97.4 F (36.3 C)  TempSrc: Oral  Weight: 217 lb 6.4 oz (98.6 kg)  Height: 5' 2.2" (1.58 m)   Body mass index is 39.51 kg/m.  Wt Readings from Last 3 Encounters:  02/23/20 217 lb 6.4 oz (98.6 kg)  12/19/19 227 lb 6.4 oz (103.1 kg)  09/27/19 224 lb 12.8 oz (102 kg)   Objective:  Physical Exam Constitutional:      General: She is not in acute distress.    Appearance: Normal appearance. She is well-developed. She is obese.  HENT:     Head: Normocephalic and atraumatic.     Right Ear: Hearing, tympanic membrane, ear canal and external ear normal. There is no impacted cerumen.     Left Ear: Hearing, tympanic membrane, ear canal and external ear normal. There is no impacted cerumen.     Nose: Nose normal.     Mouth/Throat:     Mouth: Mucous membranes are dry.  Eyes:     General: Lids are normal.     Extraocular Movements: Extraocular movements intact.     Conjunctiva/sclera: Conjunctivae normal.     Pupils: Pupils are equal, round, and reactive to light.     Funduscopic exam:    Right eye: No papilledema.        Left eye: No papilledema.  Neck:     Thyroid: No thyroid mass.     Vascular: No carotid bruit.  Cardiovascular:     Rate and Rhythm: Normal rate and regular rhythm.     Pulses: Normal pulses.          Dorsalis pedis pulses are 2+ on the right side and 2+ on the left side.     Heart sounds: Normal heart sounds. No murmur heard.   Pulmonary:     Effort: Pulmonary effort is normal.     Breath sounds: Normal breath sounds.  Abdominal:     General: Abdomen is flat. Bowel sounds are normal. There is no  distension.     Palpations: Abdomen is soft.     Tenderness: There is no abdominal tenderness.  Genitourinary:    Rectum: Guaiac result negative.  Musculoskeletal:        General: No swelling. Normal range of motion.     Cervical back: Full passive range of motion without pain, normal range of motion and neck supple.     Right lower leg: No edema.     Left lower leg: No edema.  Feet:     Right foot:     Skin integrity: Skin integrity normal.     Left foot:     Skin integrity: Skin integrity normal.     Comments: R toe swelling around cuticle, tender to palpation. Slight erythema/warmth. No drainage.  Skin:    General: Skin is warm and dry.     Capillary Refill: Capillary  refill takes less than 2 seconds.  Neurological:     General: No focal deficit present.     Mental Status: She is alert and oriented to person, place, and time.     Cranial Nerves: No cranial nerve deficit.     Sensory: No sensory deficit.  Psychiatric:        Mood and Affect: Mood normal.        Behavior: Behavior normal.        Thought Content: Thought content normal.        Judgment: Judgment normal.         Assessment And Plan:     1. Routine general medical examination at a health care facility Comments: A full exam was performed. Importance of monthly self breast exams was discussed with the patient.  - Hemoglobin A1c - Hepatitis C antibody - VITAMIN D 25 Hydroxy (Vit-D Deficiency, Fractures) - CBC - CMP14+EGFR - Lipid panel - [facility]  2. Essential hypertension Comments: Chronic, fair control. she has yet to take meds today. EKG performed, SB w/o acute changes.Importance of medication compliance was discussed with the patient.  - POCT Urinalysis Dipstick (81002) - POCT UA - Microalbumin - EKG 12-Lead  3. Paronychia of great toe, right Comments: She was given rx Keflex. She would benefit from warm soaks of her right foot as well. Also advised to apply Neosporin to cuticle twice daily. She will let  me know if her sx persist/worsen.   4. Class 2 severe obesity due to excess calories with serious comorbidity and body mass index (BMI) of 39.0 to 39.9 in adult [facility])  We discussed the use of Wegovy to address obesity. She confirms there is no personal/family h/o thyroid cancer. She was instructed on how to self administer the medication. Possible side effects including nausea, constipation and diarrhea were discussed with the patient.. She is reminded to stop eating when full. She will start with 0.12m x 4 weeks, then 0.563mx 4 weeks, then 67m65m 4 weeks. All questions were answered to her satisfaction. She is in agreement with her treatment plan. She will rto in 6-8 weeks for re-evaluation.   Patient was given opportunity to ask questions. Patient verbalized understanding of the plan and was able to repeat key elements of the plan. All questions were answered to their satisfaction.   RobMaximino GreenlandD   I, RobMaximino GreenlandD, have reviewed all documentation for this visit. The documentation on 02/26/20 for the exam, diagnosis, procedures, and orders are all accurate and complete.  THE PATIENT IS ENCOURAGED TO PRACTICE SOCIAL DISTANCING DUE TO THE COVID-19 PANDEMIC.

## 2020-02-23 NOTE — Patient Instructions (Signed)
Health Maintenance, Female Adopting a healthy lifestyle and getting preventive care are important in promoting health and wellness. Ask your health care provider about:  The right schedule for you to have regular tests and exams.  Things you can do on your own to prevent diseases and keep yourself healthy. What should I know about diet, weight, and exercise? Eat a healthy diet   Eat a diet that includes plenty of vegetables, fruits, low-fat dairy products, and lean protein.  Do not eat a lot of foods that are high in solid fats, added sugars, or sodium. Maintain a healthy weight Body mass index (BMI) is used to identify weight problems. It estimates body fat based on height and weight. Your health care provider can help determine your BMI and help you achieve or maintain a healthy weight. Get regular exercise Get regular exercise. This is one of the most important things you can do for your health. Most adults should:  Exercise for at least 150 minutes each week. The exercise should increase your heart rate and make you sweat (moderate-intensity exercise).  Do strengthening exercises at least twice a week. This is in addition to the moderate-intensity exercise.  Spend less time sitting. Even light physical activity can be beneficial. Watch cholesterol and blood lipids Have your blood tested for lipids and cholesterol at 55 years of age, then have this test every 5 years. Have your cholesterol levels checked more often if:  Your lipid or cholesterol levels are high.  You are older than 55 years of age.  You are at high risk for heart disease. What should I know about cancer screening? Depending on your health history and family history, you may need to have cancer screening at various ages. This may include screening for:  Breast cancer.  Cervical cancer.  Colorectal cancer.  Skin cancer.  Lung cancer. What should I know about heart disease, diabetes, and high blood  pressure? Blood pressure and heart disease  High blood pressure causes heart disease and increases the risk of stroke. This is more likely to develop in people who have high blood pressure readings, are of African descent, or are overweight.  Have your blood pressure checked: ? Every 3-5 years if you are 18-39 years of age. ? Every year if you are 40 years old or older. Diabetes Have regular diabetes screenings. This checks your fasting blood sugar level. Have the screening done:  Once every three years after age 40 if you are at a normal weight and have a low risk for diabetes.  More often and at a younger age if you are overweight or have a high risk for diabetes. What should I know about preventing infection? Hepatitis B If you have a higher risk for hepatitis B, you should be screened for this virus. Talk with your health care provider to find out if you are at risk for hepatitis B infection. Hepatitis C Testing is recommended for:  Everyone born from 1945 through 1965.  Anyone with known risk factors for hepatitis C. Sexually transmitted infections (STIs)  Get screened for STIs, including gonorrhea and chlamydia, if: ? You are sexually active and are younger than 55 years of age. ? You are older than 55 years of age and your health care provider tells you that you are at risk for this type of infection. ? Your sexual activity has changed since you were last screened, and you are at increased risk for chlamydia or gonorrhea. Ask your health care provider if   you are at risk.  Ask your health care provider about whether you are at high risk for HIV. Your health care provider may recommend a prescription medicine to help prevent HIV infection. If you choose to take medicine to prevent HIV, you should first get tested for HIV. You should then be tested every 3 months for as long as you are taking the medicine. Pregnancy  If you are about to stop having your period (premenopausal) and  you may become pregnant, seek counseling before you get pregnant.  Take 400 to 800 micrograms (mcg) of folic acid every day if you become pregnant.  Ask for birth control (contraception) if you want to prevent pregnancy. Osteoporosis and menopause Osteoporosis is a disease in which the bones lose minerals and strength with aging. This can result in bone fractures. If you are 65 years old or older, or if you are at risk for osteoporosis and fractures, ask your health care provider if you should:  Be screened for bone loss.  Take a calcium or vitamin D supplement to lower your risk of fractures.  Be given hormone replacement therapy (HRT) to treat symptoms of menopause. Follow these instructions at home: Lifestyle  Do not use any products that contain nicotine or tobacco, such as cigarettes, e-cigarettes, and chewing tobacco. If you need help quitting, ask your health care provider.  Do not use street drugs.  Do not share needles.  Ask your health care provider for help if you need support or information about quitting drugs. Alcohol use  Do not drink alcohol if: ? Your health care provider tells you not to drink. ? You are pregnant, may be pregnant, or are planning to become pregnant.  If you drink alcohol: ? Limit how much you use to 0-1 drink a day. ? Limit intake if you are breastfeeding.  Be aware of how much alcohol is in your drink. In the [place] one drink equals one 12 oz bottle of beer (355 mL), one 5 oz glass of wine (148 mL), or one 1 oz glass of hard liquor (44 mL). General instructions  Schedule regular health, dental, and eye exams.  Stay current with your vaccines.  Tell your health care provider if: ? You often feel depressed. ? You have ever been abused or do not feel safe at home. Summary  Adopting a healthy lifestyle and getting preventive care are important in promoting health and wellness.  Follow your health care provider's instructions about healthy  diet, exercising, and getting tested or screened for diseases.  Follow your health care provider's instructions on monitoring your cholesterol and blood pressure. This information is not intended to replace advice given to you by your health care provider. Make sure you discuss any questions you have with your health care provider. Document Revised: 06/16/2018 Document Reviewed: 06/16/2018 Elsevier Patient Education  2020 [employer]  

## 2020-02-24 ENCOUNTER — Other Ambulatory Visit: Payer: Self-pay | Admitting: Internal Medicine

## 2020-02-24 LAB — CBC
Hematocrit: 38.6 % (ref 34.0–46.6)
Hemoglobin: 12.5 g/dL (ref 11.1–15.9)
MCH: 28.9 pg (ref 26.6–33.0)
MCHC: 32.4 g/dL (ref 31.5–35.7)
MCV: 89 fL (ref 79–97)
Platelets: 297 10*3/uL (ref 150–450)
RBC: 4.32 x10E6/uL (ref 3.77–5.28)
RDW: 13.1 % (ref 11.7–15.4)
WBC: 5.6 10*3/uL (ref 3.4–10.8)

## 2020-02-24 LAB — CMP14+EGFR
ALT: 13 IU/L (ref 0–32)
AST: 15 IU/L (ref 0–40)
Albumin/Globulin Ratio: 1.4 (ref 1.2–2.2)
Albumin: 4.3 g/dL (ref 3.8–4.9)
Alkaline Phosphatase: 87 IU/L (ref 48–121)
BUN/Creatinine Ratio: 13 (ref 9–23)
BUN: 11 mg/dL (ref 6–24)
Bilirubin Total: 0.3 mg/dL (ref 0.0–1.2)
CO2: 26 mmol/L (ref 20–29)
Calcium: 9.5 mg/dL (ref 8.7–10.2)
Chloride: 101 mmol/L (ref 96–106)
Creatinine, Ser: 0.82 mg/dL (ref 0.57–1.00)
GFR calc Af Amer: 94 mL/min/{1.73_m2} (ref >59)
GFR calc non Af Amer: 81 mL/min/{1.73_m2} (ref >59)
Globulin, Total: 3.1 g/dL (ref 1.5–4.5)
Glucose: 90 mg/dL (ref 65–99)
Potassium: 4.2 mmol/L (ref 3.5–5.2)
Sodium: 139 mmol/L (ref 134–144)
Total Protein: 7.4 g/dL (ref 6.0–8.5)

## 2020-02-24 LAB — HEPATITIS C ANTIBODY: Hep C Virus Ab: 0.2 s/co ratio (ref 0.0–0.9)

## 2020-02-24 LAB — LIPID PANEL
Chol/HDL Ratio: 3 ratio (ref 0.0–4.4)
Cholesterol, Total: 172 mg/dL (ref 100–199)
HDL: 57 mg/dL (ref >39)
LDL Chol Calc (NIH): 100 mg/dL — ABNORMAL HIGH (ref 0–99)
Triglycerides: 80 mg/dL (ref 0–149)
VLDL Cholesterol Cal: 15 mg/dL (ref 5–40)

## 2020-02-24 LAB — HEMOGLOBIN A1C
Est. average glucose Bld gHb Est-mCnc: 120 mg/dL
Hgb A1c MFr Bld: 5.8 % — ABNORMAL HIGH (ref 4.8–5.6)

## 2020-02-24 LAB — FOLLICLE STIMULATING HORMONE: FSH: 81.6 m[IU]/mL

## 2020-02-24 LAB — VITAMIN D 25 HYDROXY (VIT D DEFICIENCY, FRACTURES): Vit D, 25-Hydroxy: 27.9 ng/mL — ABNORMAL LOW (ref 30.0–100.0)

## 2020-02-24 MED ORDER — VITAMIN D (ERGOCALCIFEROL) 1.25 MG (50000 UNIT) PO CAPS: ORAL_CAPSULE | ORAL | 1 refills | Status: AC

## 2020-02-24 MED ORDER — CEPHALEXIN 500 MG PO CAPS: 500.0000 mg | ORAL_CAPSULE | Freq: Three times a day (TID) | ORAL | 0 refills | Status: DC

## 2020-02-27 ENCOUNTER — Telehealth: Payer: Self-pay

## 2020-02-27 NOTE — Telephone Encounter (Signed)
I called the pt because Dr. Baird Cancer wanted to know how the pt's toe is and to see if the pt started her antibiotic.

## 2020-04-19 ENCOUNTER — Ambulatory Visit: Payer: 59 | Admitting: Internal Medicine

## 2020-04-19 ENCOUNTER — Encounter: Payer: Self-pay | Admitting: Internal Medicine

## 2020-04-19 ENCOUNTER — Other Ambulatory Visit: Payer: Self-pay

## 2020-04-19 VITALS — BP 156/102 | HR 78 | Temp 98.0°F | Ht 62.2 in | Wt 214.0 lb

## 2020-04-19 DIAGNOSIS — I10 Essential (primary) hypertension: Secondary | ICD-10-CM | POA: Diagnosis not present

## 2020-04-19 DIAGNOSIS — R400 Somnolence: Secondary | ICD-10-CM

## 2020-04-19 DIAGNOSIS — Z86010 Personal history of colonic polyps: Secondary | ICD-10-CM | POA: Diagnosis not present

## 2020-04-19 DIAGNOSIS — Z6838 Body mass index (BMI) 38.0-38.9, adult: Secondary | ICD-10-CM

## 2020-04-19 MED ORDER — VALSARTAN-HYDROCHLOROTHIAZIDE 160-12.5 MG PO TABS: 1.0000 | ORAL_TABLET | Freq: Every day | ORAL | 1 refills | Status: DC

## 2020-04-19 NOTE — Patient Instructions (Signed)

## 2020-04-19 NOTE — Progress Notes (Signed)
Rutherford Nail as a scribe for Maximino Greenland, MD.,have documented all relevant documentation on the behalf of Maximino Greenland, MD,as directed by  Maximino Greenland, MD while in the presence of Maximino Greenland, MD. This visit occurred during the SARS-CoV-2 public health emergency.  Safety protocols were in place, including screening questions prior to the visit, additional usage of staff PPE, and extensive cleaning of exam room while observing appropriate contact time as indicated for disinfecting solutions.  Subjective:     Patient ID: Misty [name] , female    DOB: 07-Feb-1966 , 54 y.o.   MRN: [id number]   Chief Complaint  Patient presents with  . Weight Check    HPI  Patient here for a weight check.  She was started on Wegovy/Ozempic at her last visit. She is not taking at this time. She did not continue taking once she completed 0.25mg  once weekly dose. She admits she is not exercising as much as she should.   Wt Readings from Last 3 Encounters: 04/19/20 : 214 lb (97.1 kg) 02/23/20 : 217 lb 6.4 oz (98.6 kg) 12/19/19 : 227 lb 6.4 oz (103.1 kg)    Past Medical History:  Diagnosis Date  . Hypertension   . Morbid obesity due to excess calories (HCC)      Family History  Problem Relation Age of Onset  . Osteoporosis Mother   . Breast cancer Sister      Current Outpatient Medications:  .  cetirizine (ZYRTEC) 10 MG tablet, Take 10 mg by mouth daily., Disp: , Rfl:  .  Magnesium Hydroxide (MAGNESIA PO), Take 1 tablet by mouth daily., Disp: , Rfl:  .  Multiple Vitamins-Minerals (CENTRUM SILVER 50+WOMEN PO), Take by mouth., Disp: , Rfl:  .  Vitamin D, Ergocalciferol, (DRISDOL) 1.25 MG (50000 UNIT) CAPS capsule, One capsule po twice weekly on Tuesdays/Fridays, Disp: 24 capsule, Rfl: 1 .  mometasone (NASONEX) 50 MCG/ACT nasal spray, Place 2 sprays into the nose daily. (Patient not taking: Reported on 04/19/2020), Disp: 17 g, Rfl: 2 .  Semaglutide-Weight Management  (WEGOVY) 0.5 MG/0.5ML SOAJ, Inject 0.5 mg into the skin once a week. (Patient not taking: Reported on 04/19/2020), Disp: 2 mL, Rfl: 0 .  valsartan-hydrochlorothiazide (DIOVAN HCT) 160-12.5 MG tablet, Take 1 tablet by mouth daily., Disp: 30 tablet, Rfl: 1   No Known Allergies   Review of Systems  Constitutional: Negative.   Respiratory: Negative.   Cardiovascular: Negative.   Gastrointestinal: Negative.   Neurological: Negative.   Psychiatric/Behavioral: Negative.      Today's Vitals   04/19/20 1617  BP: (!) 156/102  Pulse: 78  Temp: 98 F (36.7 C)  TempSrc: Oral  Weight: 214 lb (97.1 kg)  Height: 5' 2.2" (1.58 m)  PainSc: 0-No pain   Body mass index is 38.89 kg/m.   Objective:  Physical Exam Vitals and nursing note reviewed.  Constitutional:      Appearance: Normal appearance. She is obese.  HENT:     Head: Normocephalic and atraumatic.  Cardiovascular:     Rate and Rhythm: Normal rate and regular rhythm.     Heart sounds: Normal heart sounds.  Pulmonary:     Effort: Pulmonary effort is normal.     Breath sounds: Normal breath sounds.  Skin:    General: Skin is warm.  Neurological:     General: No focal deficit present.     Mental Status: She is alert.  Psychiatric:        Mood  and Affect: Mood normal.        Behavior: Behavior normal.         Assessment And Plan:     1. Class 2 severe obesity due to excess calories with serious comorbidity and body mass index (BMI) of 38.0 to 38.9 in adult [facility]) Comments: She has lost 13 pounds since June 2021. She was congratulated on her continued weight loss and encouraged to aim for at least 150 minutes of exercise per week.  2. Essential hypertension Comments: Chronic, uncontrolled. I will add valsartan/hctz 160/12.5mg  daily. She will rto in six weeks for re-evaluation. Advised to stop hctz.   3. Daytime somnolence Comments: Also with sx suggestive of OSA. Will consider Neuro eval for sleep study evaluation.   4.  Personal history of colonic polyps Comments: She agrees to GI referral for CRC screening.  - Ambulatory referral to Gastroenterology     Patient was given opportunity to ask questions. Patient verbalized understanding of the plan and was able to repeat key elements of the plan. All questions were answered to their satisfaction.  Maximino Greenland, MD   I, Maximino Greenland, MD, have reviewed all documentation for this visit. The documentation on 04/23/20 for the exam, diagnosis, procedures, and orders are all accurate and complete.  THE PATIENT IS ENCOURAGED TO PRACTICE SOCIAL DISTANCING DUE TO THE COVID-19 PANDEMIC.

## 2020-05-01 ENCOUNTER — Other Ambulatory Visit: Payer: Self-pay

## 2020-05-01 ENCOUNTER — Ambulatory Visit (HOSPITAL_COMMUNITY)
Admission: EM | Admit: 2020-05-01 | Discharge: 2020-05-01 | Disposition: A | Discharge status: Home / Self Care | Payer: 59 | Attending: Family Medicine | Admitting: Family Medicine

## 2020-05-01 ENCOUNTER — Encounter (HOSPITAL_COMMUNITY): Payer: Self-pay | Admitting: Emergency Medicine

## 2020-05-01 DIAGNOSIS — R059 Cough, unspecified: Secondary | ICD-10-CM | POA: Diagnosis present

## 2020-05-01 DIAGNOSIS — J069 Acute upper respiratory infection, unspecified: Secondary | ICD-10-CM

## 2020-05-01 DIAGNOSIS — U071 COVID-19: Secondary | ICD-10-CM | POA: Diagnosis not present

## 2020-05-01 NOTE — ED Notes (Signed)
covid swab obtained and remains at bedside

## 2020-05-01 NOTE — ED Provider Notes (Signed)
[facility]    CSN: [id number] Arrival date & time: 05/01/20  1223      History   Chief Complaint Chief Complaint  Patient presents with  . Cough    HPI Misty [name] is a 54 y.o. female.   Patient is a 54 year old female presents today with cough, fever, sinus headache.  This started yesterday.  Has not taken over-the-counter medication.  Her 84 year old son tested positive on Friday for Covid.  She had negative rapid test but this was prior to her symptoms starting.  No chest pain or shortness of breath.     Past Medical History:  Diagnosis Date  . Hypertension   . Morbid obesity due to excess calories [facility])     Patient Active Problem List   Diagnosis Date Noted  . Hypertension 07/12/2012    Past Surgical History:  Procedure Laterality Date  . CESAREAN SECTION    . OOPHORECTOMY      OB History    Gravida  2   Para  1   Term      Preterm      AB      Living  1     SAB      TAB      Ectopic      Multiple      Live Births               Home Medications    Prior to Admission medications   Medication Sig Start Date End Date Taking? Authorizing Provider  cetirizine (ZYRTEC) 10 MG tablet Take 10 mg by mouth daily.   Yes [provider]  Magnesium Hydroxide (MAGNESIA PO) Take 1 tablet by mouth daily.   Yes [provider]  Multiple Vitamins-Minerals (CENTRUM SILVER 50+WOMEN PO) Take by mouth.   Yes [provider]  valsartan-hydrochlorothiazide (DIOVAN HCT) 160-12.5 MG tablet Take 1 tablet by mouth daily. 04/19/20 04/19/21 Yes Glendale Chard, MD  Vitamin D, Ergocalciferol, (DRISDOL) 1.25 MG (50000 UNIT) CAPS capsule One capsule po twice weekly on Tuesdays/Fridays 02/24/20  Yes Glendale Chard, MD  mometasone (NASONEX) 50 MCG/ACT nasal spray Place 2 sprays into the nose daily. Patient not taking: Reported on 04/19/2020 10/01/18 04/19/20  Minette Brine, FNP  Semaglutide-Weight Management (WEGOVY) 0.5  MG/0.5ML SOAJ Inject 0.5 mg into the skin once a week. Patient not taking: Reported on 04/19/2020 02/23/20   Glendale Chard, MD    Family History Family History  Problem Relation Age of Onset  . Osteoporosis Mother   . Breast cancer Sister     Social History Social History   Tobacco Use  . Smoking status: Never Smoker  . Smokeless tobacco: Never Used  Vaping Use  . Vaping Use: Never used  Substance Use Topics  . Alcohol use: No  . Drug use: No     Allergies   Patient has no known allergies.   Review of Systems Review of Systems   Physical Exam Triage Vital Signs ED Triage Vitals  Enc Vitals Group     BP 05/01/20 1414 (!) 153/99     Pulse Rate 05/01/20 1414 80     Resp 05/01/20 1414 18     Temp 05/01/20 1414 98.2 F (36.8 C)     Temp Source 05/01/20 1414 Oral     SpO2 05/01/20 1414 99 %     Weight --      Height --      Head Circumference --  Peak Flow --      Pain Score 05/01/20 1409 3     Pain Loc --      Pain Edu? --      Excl. in [place]? --    No data found.  Updated Vital Signs BP (!) 153/99 (BP Location: Right Arm)   Pulse 80   Temp 98.2 F (36.8 C) (Oral)   Resp 18   SpO2 99%   Visual Acuity Right Eye Distance:   Left Eye Distance:   Bilateral Distance:    Right Eye Near:   Left Eye Near:    Bilateral Near:     Physical Exam Vitals and nursing note reviewed.  Constitutional:      General: She is not in acute distress.    Appearance: Normal appearance. She is not ill-appearing, toxic-appearing or diaphoretic.  HENT:     Head: Normocephalic.     Nose: Nose normal.  Eyes:     Conjunctiva/sclera: Conjunctivae normal.  Pulmonary:     Effort: Pulmonary effort is normal.  Musculoskeletal:        General: Normal range of motion.     Cervical back: Normal range of motion.  Skin:    General: Skin is warm and dry.     Findings: No rash.  Neurological:     Mental Status: She is alert.  Psychiatric:        Mood and Affect: Mood  normal.      UC Treatments / Results  Labs (all labs ordered are listed, but only abnormal results are displayed) Labs Reviewed  SARS CORONAVIRUS 2 (TAT 6-24 HRS) - Abnormal; Notable for the following components:      Result Value   SARS Coronavirus 2 POSITIVE (*)    All other components within normal limits  RESP PANEL BY RT PCR (RSV, FLU A&B, COVID)    EKG   Radiology No results found.  Procedures Procedures (including critical care time)  Medications Ordered in UC Medications - No data to display  Initial Impression / Assessment and Plan / UC Course  I have reviewed the triage vital signs and the nursing notes.  Pertinent labs & imaging results that were available during my care of the patient were reviewed by me and considered in my medical decision making (see chart for details).     Viral URI with cough Aspiration for Covid.  Covid swab pending.  Over-the-counter medicines as needed. Follow up as needed for continued or worsening symptoms  Final Clinical Impressions(s) / UC Diagnoses   Final diagnoses:  Viral URI with cough     Discharge Instructions     Checking you for covid and flu OTC medicines as needed.  Follow up as needed for continued or worsening symptoms     ED Prescriptions    None     PDMP not reviewed this encounter.   Orvan July, NP 05/02/20 [phone]

## 2020-05-01 NOTE — ED Triage Notes (Signed)
Started feeling bad yesterday.  Patient reports a sinus headache, then started feeling bad at nighttime.  Took otc medicine.  Temp was 100.4.  Patient's 55 yo son tested positive on Friday, on 10/20 patient's son started feeling bad.  Patient did a rapid test on 9/21, negative.  On Sunday 10/24, patient purchased a test and test result was negative

## 2020-05-01 NOTE — Discharge Instructions (Signed)
Checking you for covid and flu OTC medicines as needed.  Follow up as needed for continued or worsening symptoms

## 2020-05-02 LAB — SARS CORONAVIRUS 2 (TAT 6-24 HRS): SARS Coronavirus 2: POSITIVE — AB

## 2020-05-03 ENCOUNTER — Telehealth: Payer: Self-pay | Admitting: Adult Health

## 2020-05-03 ENCOUNTER — Telehealth (HOSPITAL_COMMUNITY): Payer: Self-pay

## 2020-05-03 NOTE — Telephone Encounter (Signed)
Called and LMOM regarding monoclonal antibody treatment for COVID 19 given to those who are at risk for complications and/or hospitalization of the virus.  Patient meets criteria based on: BMI greater than 25  Call back number given: [phone]  My chart message: sent  Wilber Bihari, NP

## 2020-05-03 NOTE — Telephone Encounter (Signed)
Called to Discuss with patient about Covid symptoms and the use of the monoclonal antibody infusion for those with mild to moderate Covid symptoms and at a high risk of hospitalization.     Pt appears to qualify for this infusion due to co-morbid conditions and/or a member of an at-risk group in accordance with the FDA Emergency Use Authorization.    Pt stated that her symptoms started on 04/30/20 and tested positive on 05/02/20. Pt states her symptoms include congestion, cough, tired/achy, and diminished taste. Pt states she takes medication for HTN. Pt informed an APP will reach out to schedule for an appointment. Pt stated she will call her insurance to find out if the treatment is covered.

## 2020-05-04 ENCOUNTER — Telehealth: Payer: Self-pay | Admitting: Family

## 2020-05-04 ENCOUNTER — Other Ambulatory Visit: Payer: Self-pay | Admitting: Family

## 2020-05-04 ENCOUNTER — Ambulatory Visit (HOSPITAL_COMMUNITY)
Admission: RE | Admit: 2020-05-04 | Discharge: 2020-05-04 | Disposition: A | Discharge status: Home / Self Care | Payer: 59 | Source: Ambulatory Visit | Attending: Pulmonary Disease | Admitting: Pulmonary Disease

## 2020-05-04 DIAGNOSIS — U071 COVID-19: Secondary | ICD-10-CM | POA: Insufficient documentation

## 2020-05-04 DIAGNOSIS — Z23 Encounter for immunization: Secondary | ICD-10-CM | POA: Insufficient documentation

## 2020-05-04 MED ORDER — SODIUM CHLORIDE 0.9 % IV SOLN: Freq: Once | INTRAVENOUS | Status: AC

## 2020-05-04 MED ORDER — ALBUTEROL SULFATE HFA 108 (90 BASE) MCG/ACT IN AERS: 2.0000 | INHALATION_SPRAY | Freq: Once | RESPIRATORY_TRACT | Status: DC | PRN

## 2020-05-04 MED ORDER — SODIUM CHLORIDE 0.9 % IV SOLN: INTRAVENOUS | Status: DC | PRN

## 2020-05-04 MED ORDER — DIPHENHYDRAMINE HCL 50 MG/ML IJ SOLN: 50.0000 mg | Freq: Once | INTRAMUSCULAR | Status: DC | PRN

## 2020-05-04 MED ORDER — EPINEPHRINE 0.3 MG/0.3ML IJ SOAJ: 0.3000 mg | Freq: Once | INTRAMUSCULAR | Status: DC | PRN

## 2020-05-04 MED ORDER — METHYLPREDNISOLONE SODIUM SUCC 125 MG IJ SOLR: 125.0000 mg | Freq: Once | INTRAMUSCULAR | Status: DC | PRN

## 2020-05-04 MED ORDER — FAMOTIDINE IN NACL 20-0.9 MG/50ML-% IV SOLN: 20.0000 mg | Freq: Once | INTRAVENOUS | Status: DC | PRN

## 2020-05-04 NOTE — Telephone Encounter (Signed)
Called to discuss with Misty [name] about Covid symptoms and potential candidacy for the use of casirivimab/imdevimab, a combination monoclonal antibody infusion for those with mild to moderate Covid symptoms and at a high risk of hospitalization.     Pt is qualified for this infusion at the infusion center due to co-morbid conditions and/or a member of an at-risk group. Patient had my chart message interaction with member of [facility] team and requested to discuss the infusion, however unable to reach patient. Patient provided an alternate phone number, and upon calling no answer to this number or the home number in demographics. VM left at both.   Misty Larocca,NP

## 2020-05-04 NOTE — Progress Notes (Signed)
  Diagnosis: COVID-19  Physician: Asencion Noble, MD  Procedure: Covid Infusion Clinic Med: bamlanivimab\etesevimab infusion - Provided patient with bamlanimivab\etesevimab fact sheet for patients, parents and caregivers prior to infusion.  Complications: No immediate complications noted.  Discharge: Discharged home   Janne Napoleon 05/04/2020

## 2020-05-04 NOTE — Discharge Instructions (Signed)

## 2020-05-04 NOTE — Progress Notes (Signed)
VM left initially for patient. Patient called back. I connected by phone with Misty [name] on 05/04/2020 at 9:22 AM to discuss the potential use of a new treatment for mild to moderate COVID-19 viral infection in non-hospitalized patients.  This patient is a 54 y.o. female that meets the FDA criteria for Emergency Use Authorization of COVID monoclonal antibody casirivimab/imdevimab or bamlanivimab/eteseviamb.  Has a (+) direct SARS-CoV-2 viral test result  Has mild or moderate COVID-19   Is NOT hospitalized due to COVID-19  Is within 10 days of symptom onset  Has at least one of the high risk factor(s) for progression to severe COVID-19 and/or hospitalization as defined in EUA.  Specific high risk criteria : BMI > 25 and Cardiovascular disease or hypertension   Symptoms of cough, fever, headache began 10/25. Patient now has lost some smell and taste.    I have spoken and communicated the following to the patient or parent/caregiver regarding COVID monoclonal antibody treatment:  1. FDA has authorized the emergency use for the treatment of mild to moderate COVID-19 in adults and pediatric patients with positive results of direct SARS-CoV-2 viral testing who are 68 years of age and older weighing at least 40 kg, and who are at high risk for progressing to severe COVID-19 and/or hospitalization.  2. The significant known and potential risks and benefits of COVID monoclonal antibody, and the extent to which such potential risks and benefits are unknown.  3. Information on available alternative treatments and the risks and benefits of those alternatives, including clinical trials.  4. Patients treated with COVID monoclonal antibody should continue to self-isolate and use infection control measures (e.g., wear mask, isolate, social distance, avoid sharing personal items, clean and disinfect "high touch" surfaces, and frequent handwashing) according to CDC guidelines.   5. The patient  or parent/caregiver has the option to accept or refuse COVID monoclonal antibody treatment.  After reviewing this information with the patient, the patient has agreed to receive one of the available covid 19 monoclonal antibodies and will be provided an appropriate fact sheet prior to infusion. [name], NP 05/04/2020 9:22 AM

## 2020-05-16 ENCOUNTER — Ambulatory Visit: Payer: 59 | Admitting: Internal Medicine

## 2020-05-20 ENCOUNTER — Other Ambulatory Visit: Payer: Self-pay | Admitting: Internal Medicine

## 2020-05-29 ENCOUNTER — Ambulatory Visit: Payer: 59 | Admitting: Internal Medicine

## 2020-05-29 ENCOUNTER — Other Ambulatory Visit: Payer: Self-pay

## 2020-05-29 ENCOUNTER — Encounter: Payer: Self-pay | Admitting: Internal Medicine

## 2020-05-29 VITALS — BP 128/94 | HR 70 | Temp 98.2°F | Ht 62.5 in | Wt 210.6 lb

## 2020-05-29 DIAGNOSIS — Z6837 Body mass index (BMI) 37.0-37.9, adult: Secondary | ICD-10-CM | POA: Diagnosis not present

## 2020-05-29 DIAGNOSIS — I10 Essential (primary) hypertension: Secondary | ICD-10-CM | POA: Diagnosis not present

## 2020-05-29 DIAGNOSIS — Z8616 Personal history of COVID-19: Secondary | ICD-10-CM

## 2020-05-29 MED ORDER — VALSARTAN-HYDROCHLOROTHIAZIDE 160-25 MG PO TABS: 1.0000 | ORAL_TABLET | Freq: Every day | ORAL | 1 refills | Status: DC

## 2020-05-29 NOTE — Patient Instructions (Signed)

## 2020-05-29 NOTE — Progress Notes (Signed)
I,Katawbba Wiggins,acting as a Education administrator for Maximino Greenland, MD.,have documented all relevant documentation on the behalf of Maximino Greenland, MD,as directed by  Maximino Greenland, MD while in the presence of Maximino Greenland, MD.  This visit occurred during the SARS-CoV-2 public health emergency.  Safety protocols were in place, including screening questions prior to the visit, additional usage of staff PPE, and extensive cleaning of exam room while observing appropriate contact time as indicated for disinfecting solutions.  Subjective:     Patient ID: Misty [name] , female    DOB: [birth year] , 54 y.o.   MRN: [id number]   Chief Complaint  Patient presents with  . Hypertension    HPI  She reports today for BP check. She has been taking valsartan/hct 160/12.51m daily. She has not had any issues with the medication.   Hypertension This is a chronic problem. The current episode started more than 1 year ago. The problem has been gradually improving since onset. The problem is uncontrolled. Pertinent negatives include no blurred vision, chest pain, palpitations or shortness of breath. Risk factors for coronary artery disease include obesity and sedentary lifestyle. Past treatments include diuretics. The current treatment provides mild improvement.     Past Medical History:  Diagnosis Date  . Hypertension   . Morbid obesity due to excess calories (HCC)      Family History  Problem Relation Age of Onset  . Osteoporosis Mother   . Breast cancer Sister      Current Outpatient Medications:  .  cetirizine (ZYRTEC) 10 MG tablet, Take 10 mg by mouth daily., Disp: , Rfl:  .  Magnesium Hydroxide (MAGNESIA PO), Take 1 tablet by mouth daily., Disp: , Rfl:  .  Multiple Vitamins-Minerals (CENTRUM SILVER 50+WOMEN PO), Take by mouth., Disp: , Rfl:  .  Saline 0.2 % SOLN, Place into the nose. prn, Disp: , Rfl:  .  Vitamin D, Ergocalciferol, (DRISDOL) 1.25 MG (50000 UNIT) CAPS capsule, One capsule  po twice weekly on Tuesdays/Fridays, Disp: 24 capsule, Rfl: 1 .  valsartan-hydrochlorothiazide (DIOVAN HCT) 160-25 MG tablet, Take 1 tablet by mouth daily., Disp: 30 tablet, Rfl: 1   No Known Allergies   Review of Systems  Constitutional: Negative.   Eyes: Negative for blurred vision.  Respiratory: Negative.  Negative for shortness of breath.   Cardiovascular: Negative.  Negative for chest pain and palpitations.  Gastrointestinal: Negative.   Neurological: Negative.   Psychiatric/Behavioral: Negative.      Today's Vitals   05/29/20 1046  BP: (!) 128/94  Pulse: 70  Temp: 98.2 F (36.8 C)  TempSrc: Oral  Weight: 210 lb 9.6 oz (95.5 kg)  Height: 5' 2.5" (1.588 m)   Body mass index is 37.91 kg/m.  Wt Readings from Last 3 Encounters:  05/29/20 210 lb 9.6 oz (95.5 kg)  04/19/20 214 lb (97.1 kg)  02/23/20 217 lb 6.4 oz (98.6 kg)   Objective:  Physical Exam Vitals and nursing note reviewed.  Constitutional:      Appearance: Normal appearance. She is obese.  HENT:     Head: Normocephalic and atraumatic.  Cardiovascular:     Rate and Rhythm: Normal rate and regular rhythm.     Heart sounds: Normal heart sounds.  Pulmonary:     Effort: Pulmonary effort is normal.     Breath sounds: Normal breath sounds.  Skin:    General: Skin is warm.  Neurological:     General: No focal deficit present.  Mental Status: She is alert.  Psychiatric:        Mood and Affect: Mood normal.        Behavior: Behavior normal.         Assessment And Plan:     1. Essential hypertension Comments: Chronic, improved control. Not yet at goal. I will increase valsartan/hctz to 160/43m daily. She agrees to rto in six weeks for re-evaluation.  - BMP8+EGFR  2. Class 2 severe obesity due to excess calories with serious comorbidity and body mass index (BMI) of 37.0 to 37.9 in adult ([facility] Comments: She has lost another 4 pounds since her last visit. She is no longer taking Wegovy, prefers to work  on weight with diet/exercise only.  3. Personal history of COVID-19 Comments: She received monoclonal antibody infusion on 05/04/20.  Her last day of quarantine was 05/10/20.  She is advised to get booster in Feb 2022.    Patient was given opportunity to ask questions. Patient verbalized understanding of the plan and was able to repeat key elements of the plan. All questions were answered to their satisfaction.  RMaximino Greenland MD   I, RMaximino Greenland MD, have reviewed all documentation for this visit. The documentation on 06/02/20 for the exam, diagnosis, procedures, and orders are all accurate and complete.  THE PATIENT IS ENCOURAGED TO PRACTICE SOCIAL DISTANCING DUE TO THE COVID-19 PANDEMIC.

## 2020-05-30 LAB — BMP8+EGFR
BUN/Creatinine Ratio: 15 (ref 9–23)
BUN: 13 mg/dL (ref 6–24)
CO2: 26 mmol/L (ref 20–29)
Calcium: 9.6 mg/dL (ref 8.7–10.2)
Chloride: 99 mmol/L (ref 96–106)
Creatinine, Ser: 0.89 mg/dL (ref 0.57–1.00)
GFR calc Af Amer: 84 mL/min/{1.73_m2} (ref >59)
GFR calc non Af Amer: 73 mL/min/{1.73_m2} (ref >59)
Glucose: 91 mg/dL (ref 65–99)
Potassium: 4.2 mmol/L (ref 3.5–5.2)
Sodium: 138 mmol/L (ref 134–144)

## 2020-06-22 ENCOUNTER — Telehealth: Payer: Self-pay

## 2020-06-22 NOTE — Telephone Encounter (Signed)
I called the pt because Dr. Baird Cancer wanted to know if she is taking her bp meds because the pt's bp was quite high at her GYN appt.  If the pt is taking her meds daily Dr. Baird Cancer is going to add hctz 12.5 mg daily to her current regimen.

## 2020-07-24 ENCOUNTER — Ambulatory Visit: Payer: 59 | Admitting: Internal Medicine

## 2020-07-26 LAB — HM COLONOSCOPY

## 2020-07-27 ENCOUNTER — Encounter: Payer: Self-pay | Admitting: Internal Medicine

## 2020-08-03 ENCOUNTER — Encounter: Payer: Self-pay | Admitting: Internal Medicine

## 2020-08-30 ENCOUNTER — Ambulatory Visit: Payer: 59 | Admitting: Internal Medicine

## 2020-09-13 ENCOUNTER — Ambulatory Visit: Payer: 59 | Admitting: Nurse Practitioner

## 2020-09-13 ENCOUNTER — Other Ambulatory Visit: Payer: Self-pay

## 2020-09-13 VITALS — BP 136/80 | HR 62 | Temp 98.5°F | Ht 63.6 in | Wt 216.4 lb

## 2020-09-13 DIAGNOSIS — I10 Essential (primary) hypertension: Secondary | ICD-10-CM

## 2020-09-13 DIAGNOSIS — L03031 Cellulitis of right toe: Secondary | ICD-10-CM | POA: Diagnosis not present

## 2020-09-13 DIAGNOSIS — R059 Cough, unspecified: Secondary | ICD-10-CM | POA: Diagnosis not present

## 2020-09-13 MED ORDER — VALSARTAN-HYDROCHLOROTHIAZIDE 160-25 MG PO TABS: 1.0000 | ORAL_TABLET | Freq: Every day | ORAL | 5 refills | Status: DC

## 2020-09-13 MED ORDER — BENZONATATE 100 MG PO CAPS: 100.0000 mg | ORAL_CAPSULE | Freq: Three times a day (TID) | ORAL | 0 refills | Status: DC | PRN

## 2020-09-13 NOTE — Patient Instructions (Signed)

## 2020-09-13 NOTE — Progress Notes (Signed)
I,Tianna Badgett,acting as a Education administrator for [employer], NP.,have documented all relevant documentation on the behalf of [employer], NP,as directed by  Bary Castilla, NP while in the presence of Bary Castilla, NP.  This visit occurred during the SARS-CoV-2 public health emergency.  Safety protocols were in place, including screening questions prior to the visit, additional usage of staff PPE, and extensive cleaning of exam room while observing appropriate contact time as indicated for disinfecting solutions.  Subjective:     Patient ID: Misty [name] , female    DOB: 10-04-1965 , 55 y.o.   MRN: [id number]   Chief Complaint  Patient presents with  . Hypertension    HPI  She reports today for BP check. She has been taking valsartan/hct 160/25mg  daily. She has not had any issues with the medication. She still has the issue of having tenderness to her right great toe. She was once treated for it in Aug of 2021. It is still blackened. She would like to see a podiatrist for it. Also, she had COVID back in October and since then has been having this lingering cough from it. No other concerns.   Diet: Could be better. She is working out more. She has started walking  Wt Readings from Last 3 Encounters: 09/13/20 : 216 lb 6.4 oz (98.2 kg) 05/29/20 : 210 lb 9.6 oz (95.5 kg) 04/19/20 : 214 lb (97.1 kg)   Hypertension This is a chronic problem. The current episode started more than 1 year ago. The problem has been gradually improving since onset. The problem is uncontrolled. Pertinent negatives include no blurred vision, chest pain, palpitations or shortness of breath. Risk factors for coronary artery disease include obesity and sedentary lifestyle. Past treatments include diuretics. The current treatment provides mild improvement.     Past Medical History:  Diagnosis Date  . Hypertension   . Morbid obesity due to excess calories (HCC)      Family History  Problem  Relation Age of Onset  . Osteoporosis Mother   . Breast cancer Sister      Current Outpatient Medications:  .  benzonatate (TESSALON PERLES) 100 MG capsule, Take 1 capsule (100 mg total) by mouth 3 (three) times daily as needed for cough., Disp: 30 capsule, Rfl: 0 .  cetirizine (ZYRTEC) 10 MG tablet, Take 10 mg by mouth daily., Disp: , Rfl:  .  Magnesium Hydroxide (MAGNESIA PO), Take 1 tablet by mouth daily., Disp: , Rfl:  .  Multiple Vitamins-Minerals (CENTRUM SILVER 50+WOMEN PO), Take by mouth., Disp: , Rfl:  .  Saline 0.2 % SOLN, Place into the nose. prn, Disp: , Rfl:  .  Vitamin D, Ergocalciferol, (DRISDOL) 1.25 MG (50000 UNIT) CAPS capsule, One capsule po twice weekly on Tuesdays/Fridays, Disp: 24 capsule, Rfl: 1 .  valsartan-hydrochlorothiazide (DIOVAN HCT) 160-25 MG tablet, Take 1 tablet by mouth daily., Disp: 30 tablet, Rfl: 5   No Known Allergies   Review of Systems  Constitutional: Negative.  Negative for chills, fatigue and fever.  HENT: Negative for congestion, sinus pressure, sneezing and sore throat.   Eyes: Negative for blurred vision.  Respiratory: Positive for cough. Negative for chest tightness, shortness of breath and wheezing.   Cardiovascular: Negative.  Negative for chest pain and palpitations.  Gastrointestinal: Negative.   Skin: Positive for color change.       Great toe on right foot is discolored   Neurological: Negative for numbness.     Today's Vitals   09/13/20 0930  BP:  136/80  Pulse: 62  Temp: 98.5 F (36.9 C)  TempSrc: Oral  Weight: 216 lb 6.4 oz (98.2 kg)  Height: 5' 3.6" (1.615 m)   Body mass index is 37.61 kg/m.   Objective:  Physical Exam Constitutional:      Appearance: Normal appearance. She is obese.  HENT:     Head: Normocephalic and atraumatic.  Cardiovascular:     Rate and Rhythm: Normal rate and regular rhythm.     Pulses: Normal pulses.     Heart sounds: Normal heart sounds. No murmur heard.   Pulmonary:     Effort:  Pulmonary effort is normal. No respiratory distress.     Breath sounds: Normal breath sounds. No wheezing.  Musculoskeletal:        General: Normal range of motion.     Right foot: Tenderness present. No swelling.       Legs:     Comments: Tenderness and discoloration to right great toe   Skin:    General: Skin is dry.     Capillary Refill: Capillary refill takes less than 2 seconds.  Neurological:     Mental Status: She is alert and oriented to person, place, and time.  Psychiatric:        Mood and Affect: Mood normal.        Behavior: Behavior normal.        Thought Content: Thought content normal.        Judgment: Judgment normal.         Assessment And Plan:     1. Essential hypertension -Chronic, stable  -Continue med  - valsartan-hydrochlorothiazide (DIOVAN HCT) 160-25 MG tablet; Take 1 tablet by mouth daily.  Dispense: 30 tablet; Refill: 5 -Educated patient about the importance of eating a heart healthy diet with eliminating sodium intake and processed foods.   2. Paronychia of great toe, right -At the request of the patient, refer to podiatrist sent for her great right toe  - Ambulatory referral to Podiatry  3. Cough -Educated patient about taking OTC delsym as needed for cough. -Use cough drops as needed  - benzonatate (TESSALON PERLES) 100 MG capsule; Take 1 capsule (100 mg total) by mouth 3 (three) times daily as needed for cough.  Dispense: 30 capsule; Refill: 0 -Educated patient if cough worsen or does not get better to follow up      Patient was given opportunity to ask questions. Patient verbalized understanding of the plan and was able to repeat key elements of the plan. All questions were answered to their satisfaction.  Bary Castilla, NP   I, Bary Castilla, NP, have reviewed all documentation for this visit. The documentation on 09/13/20 for the exam, diagnosis, procedures, and orders are all accurate and complete.  THE PATIENT IS ENCOURAGED TO  PRACTICE SOCIAL DISTANCING DUE TO THE COVID-19 PANDEMIC.

## 2020-09-18 ENCOUNTER — Ambulatory Visit: Payer: 59 | Admitting: Podiatry

## 2020-09-24 ENCOUNTER — Ambulatory Visit: Payer: 59 | Admitting: Podiatry

## 2020-09-24 ENCOUNTER — Other Ambulatory Visit: Payer: Self-pay

## 2020-09-24 ENCOUNTER — Encounter: Payer: Self-pay | Admitting: Podiatry

## 2020-09-24 ENCOUNTER — Ambulatory Visit (INDEPENDENT_AMBULATORY_CARE_PROVIDER_SITE_OTHER): Payer: 59

## 2020-09-24 ENCOUNTER — Other Ambulatory Visit: Payer: Self-pay | Admitting: Podiatry

## 2020-09-24 ENCOUNTER — Telehealth: Payer: Self-pay | Admitting: Urology

## 2020-09-24 DIAGNOSIS — L603 Nail dystrophy: Secondary | ICD-10-CM

## 2020-09-24 DIAGNOSIS — M2041 Other hammer toe(s) (acquired), right foot: Secondary | ICD-10-CM

## 2020-09-24 DIAGNOSIS — D169 Benign neoplasm of bone and articular cartilage, unspecified: Secondary | ICD-10-CM | POA: Diagnosis not present

## 2020-09-24 DIAGNOSIS — M779 Enthesopathy, unspecified: Secondary | ICD-10-CM | POA: Diagnosis not present

## 2020-09-24 DIAGNOSIS — Z1211 Encounter for screening for malignant neoplasm of colon: Secondary | ICD-10-CM | POA: Insufficient documentation

## 2020-09-24 DIAGNOSIS — Z862 Personal history of diseases of the blood and blood-forming organs and certain disorders involving the immune mechanism: Secondary | ICD-10-CM | POA: Insufficient documentation

## 2020-09-24 DIAGNOSIS — M2031 Hallux varus (acquired), right foot: Secondary | ICD-10-CM

## 2020-09-24 NOTE — Patient Instructions (Signed)

## 2020-09-24 NOTE — Telephone Encounter (Addendum)
DOS: 11/07/20  REMOVAL BONE SPUR 1ST RIGHT --- 61443 PARTIAL VS TOTAL NAIL REMOVAL 1ST RIGHT --- 11730  [employer] EFFECTIVE DATE - 07/07/20  PLAN DEDUCTIBLE - $500.00 W/ $500.00 REMAINING OUT OF POCKET - $5,000.00 W/ $4,895.78 REMAINING COPAY - $150.00 / visit COINSURANCE - 20%   PER [employer] WEB SITE CPT CODE 15400 AND 86761 NO  Notification or Prior Authorization is not required for the requested services  Decision ID #:[id number]

## 2020-09-26 NOTE — Progress Notes (Signed)
Subjective:   Patient ID: Misty [name], female   DOB: 55 y.o.   MRN: [id number]   HPI 55 year old female presents the office today for concerns of her right big toenail becoming thickened separating in the middle.  She is also noticed a lump underneath the toenail on the right side.  She has been using over-the-counter antifungal medication with any improvement.  She states that she had an infection in August and she was on antibiotics which cleared this up.  She states on November it started to change colors is in regards to the toenail.  She does get discomfort the nail at times.  Currently denies any redness or drainage or any swelling.  She has no other concerns today.   Review of Systems  All other systems reviewed and are negative.  Past Medical History:  Diagnosis Date  . Hypertension   . Morbid obesity due to excess calories [facility])     Past Surgical History:  Procedure Laterality Date  . CESAREAN SECTION    . OOPHORECTOMY       Current Outpatient Medications:  .  benzonatate (TESSALON PERLES) 100 MG capsule, Take 1 capsule (100 mg total) by mouth 3 (three) times daily as needed for cough., Disp: 30 capsule, Rfl: 0 .  cetirizine (ZYRTEC) 10 MG tablet, Take 10 mg by mouth daily., Disp: , Rfl:  .  hydrocortisone 2.5 % cream, hydrocortisone 2.5 % topical cream, Disp: , Rfl:  .  Magnesium Hydroxide (MAGNESIA PO), Take 1 tablet by mouth daily., Disp: , Rfl:  .  Multiple Vitamins-Minerals (CENTRUM SILVER 50+WOMEN PO), Take by mouth., Disp: , Rfl:  .  Saline 0.2 % SOLN, Place into the nose. prn, Disp: , Rfl:  .  triamcinolone (KENALOG) 0.1 %, triamcinolone acetonide 0.1 % topical cream, Disp: , Rfl:  .  triamterene-hydrochlorothiazide (DYAZIDE) 37.5-25 MG capsule, Take 1 capsule by mouth daily., Disp: , Rfl:  .  valsartan-hydrochlorothiazide (DIOVAN HCT) 160-25 MG tablet, Take 1 tablet by mouth daily., Disp: 30 tablet, Rfl: 5 .  Vitamin D, Ergocalciferol, (DRISDOL) 1.25 MG  (50000 UNIT) CAPS capsule, One capsule po twice weekly on Tuesdays/Fridays, Disp: 24 capsule, Rfl: 1  No Known Allergies        Objective:  Physical Exam  General: AAO x3, NAD  Dermatological: Right hallux toenail hypertrophic, dystrophic with yellow-brown discoloration.  There is no edema, erythema to the toenail site itself.  However underneath the toenail appears to be a soft tissue mass versus bone spur.  There is no fluctuation crepitation.  No open lesions.  Vascular: Dorsalis Pedis artery and Posterior Tibial artery pedal pulses are 2/4 bilateral with immedate capillary fill time. There is no pain with calf compression, swelling, warmth, erythema.   Neruologic: Grossly intact via light touch bilateral.  Musculoskeletal: No gross boney pedal deformities bilateral. No pain, crepitus, or limitation noted with foot and ankle range of motion bilateral. Muscular strength 5/5 in all groups tested bilateral.  Gait: Unassisted, Nonantalgic.       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  The dorsal aspect of the distal phalanx is bone spur versus possible osteochondroma -At this point given the bone issue as well as the toenail I recommended surgical intervention to remove the bone.  Also discussed partial versus total nail removal.  Discussed that even with surgery is not a guarantee the nail will become normal and there may always be some thickening, discoloration or new nail at all. -The incision placement as well as the postoperative course was discussed with the patient. I discussed risks of the surgery which include, but not limited to, infection, bleeding, pain, swelling, need for further surgery, delayed or nonhealing, painful or  ugly scar, numbness or sensation changes, recurrence, transfer lesions, further deformity,DVT/PE, loss of toe/foot. Patient understands these risks and wishes to proceed with surgery. The surgical consent was reviewed with the patient all 3 pages were signed. No promises or guarantees were given to the outcome of the procedure. All questions were answered to the best of my ability. Before the surgery the patient was encouraged to call the office if there is any further questions. The surgery will be performed at the [facility] on an outpatient basis.  Trula Slade DPM

## 2020-10-08 ENCOUNTER — Encounter: Payer: 59 | Admitting: Podiatry

## 2020-10-15 ENCOUNTER — Encounter: Payer: 59 | Admitting: Podiatry

## 2020-10-15 LAB — HM PAP SMEAR: HM Pap smear: NORMAL

## 2020-10-16 ENCOUNTER — Encounter: Payer: Self-pay | Admitting: Internal Medicine

## 2020-10-29 ENCOUNTER — Encounter: Payer: 59 | Admitting: Podiatry

## 2020-11-06 HISTORY — PX: OTHER SURGICAL HISTORY: SHX169

## 2020-11-07 ENCOUNTER — Other Ambulatory Visit: Payer: Self-pay | Admitting: Podiatry

## 2020-11-07 ENCOUNTER — Encounter: Payer: Self-pay | Admitting: Podiatry

## 2020-11-07 DIAGNOSIS — M7751 Other enthesopathy of right foot: Secondary | ICD-10-CM | POA: Diagnosis not present

## 2020-11-07 MED ORDER — HYDROCODONE-ACETAMINOPHEN 5-325 MG PO TABS
1.0000 | ORAL_TABLET | Freq: Four times a day (QID) | ORAL | 0 refills | Status: DC | PRN
Start: 2020-11-07 — End: 2021-02-26

## 2020-11-07 MED ORDER — PROMETHAZINE HCL 25 MG PO TABS: 25.0000 mg | ORAL_TABLET | Freq: Three times a day (TID) | ORAL | 0 refills | Status: DC | PRN

## 2020-11-07 MED ORDER — CEPHALEXIN 500 MG PO CAPS: 500.0000 mg | ORAL_CAPSULE | Freq: Three times a day (TID) | ORAL | 0 refills | Status: DC

## 2020-11-07 NOTE — Progress Notes (Signed)
Post op medications sent to pharmacy 

## 2020-11-08 ENCOUNTER — Telehealth: Payer: Self-pay | Admitting: *Deleted

## 2020-11-08 NOTE — Telephone Encounter (Signed)
Called and spoke with the patient and stated that I was calling to see how patient was doing after surgery with Dr Jacqualyn Posey and patient stated that she was doing good and there was little pain and was icing and elevating and that she could wiggle her toes and that there was no fever or chills and not any nausea and I stated to only be on it for about 15 minutes on the hour and to call the office if any concerns or questions arise. Lattie Haw

## 2020-11-12 ENCOUNTER — Encounter: Payer: Self-pay | Admitting: Podiatry

## 2020-11-12 ENCOUNTER — Other Ambulatory Visit: Payer: Self-pay

## 2020-11-12 ENCOUNTER — Ambulatory Visit (INDEPENDENT_AMBULATORY_CARE_PROVIDER_SITE_OTHER): Payer: 59

## 2020-11-12 ENCOUNTER — Ambulatory Visit (INDEPENDENT_AMBULATORY_CARE_PROVIDER_SITE_OTHER): Payer: Self-pay | Admitting: Podiatry

## 2020-11-12 DIAGNOSIS — Z9889 Other specified postprocedural states: Secondary | ICD-10-CM

## 2020-11-12 DIAGNOSIS — M779 Enthesopathy, unspecified: Secondary | ICD-10-CM

## 2020-11-12 DIAGNOSIS — M79676 Pain in unspecified toe(s): Secondary | ICD-10-CM

## 2020-11-12 MED ORDER — CEPHALEXIN 500 MG PO CAPS: 500.0000 mg | ORAL_CAPSULE | Freq: Three times a day (TID) | ORAL | 0 refills | Status: DC

## 2020-11-12 MED ORDER — MUPIROCIN 2 % EX OINT: 1.0000 "application " | TOPICAL_OINTMENT | Freq: Every day | CUTANEOUS | 2 refills | Status: DC

## 2020-11-17 NOTE — Progress Notes (Signed)
Subjective: 55 year old female presents the office today for follow-up evaluation after undergoing removal of spur dorsal hallux.  The spur had penetrated the skin and part of the nail was removed.  She still on the antibiotics.  She states that she did get incision wet yesterday and had to redress it.  States her pain is controlled not having significant discomfort.  Denies any fevers, chills, nausea, vomiting.  No calf pain, chest pain, shortness of breath.  Objective: AAO x3, NAD DP/PT pulses palpable bilaterally, CRT less than 3 seconds Upon removal of the bandage on the right foot there is a granular wound present the dorsal aspect of the nailbed but there is no exposed bone today.  Incision of the distal aspect of toe is well coapted with sutures intact.  Minimal edema.  There is no erythema or warmth.  No drainage or pus or any ascending cellulitis.  There is no fluctuation crepitation.  No malodor. No pain with calf compression, swelling, warmth, erythema  Assessment: 55 year old female status post right foot bone spur excision  Plan: -All treatment options discussed with the patient including all alternatives, risks, complications.  -X-rays obtained reviewed.  No evidence of acute fracture, osteomyelitis.  Bone spurs been resected. -Antibiotic ointment and a dressing applied.  Discussed with her she can change the bandage every other day and apply similar bandage.  Appears that the bone is not exposed at this time and is granulating in. -As a precaution we will continue antibiotics. -Elevation -Monitor for any clinical signs or symptoms of infection and directed to call the office immediately should any occur or go to the ER. -Patient encouraged to call the office with any questions, concerns, change in symptoms.   *Awaiting pathology report  Trula Slade DPM

## 2020-11-19 ENCOUNTER — Encounter: Payer: Self-pay | Admitting: Podiatry

## 2020-11-22 ENCOUNTER — Other Ambulatory Visit: Payer: Self-pay

## 2020-11-22 ENCOUNTER — Encounter: Payer: Self-pay | Admitting: Podiatry

## 2020-11-22 ENCOUNTER — Ambulatory Visit (INDEPENDENT_AMBULATORY_CARE_PROVIDER_SITE_OTHER): Payer: 59 | Admitting: Podiatry

## 2020-11-22 DIAGNOSIS — M779 Enthesopathy, unspecified: Secondary | ICD-10-CM

## 2020-11-22 DIAGNOSIS — Z9889 Other specified postprocedural states: Secondary | ICD-10-CM

## 2020-11-26 NOTE — Progress Notes (Signed)
Subjective: 55 year old female presents the office today for follow-up evaluation after undergoing removal of spur dorsal hallux.  States that she is doing well not having any discomfort.  She feels the wound is healing.  Denies any swelling or redness or any drainage.  Denies any fevers, chills, nausea, vomiting.  No calf pain, chest pain, shortness of breath.  No other concerns today.   Objective: AAO x3, NAD DP/PT pulses palpable bilaterally, CRT less than 3 seconds The wound nail bed appears to be granular and there is no exposed bone.  Appears of the wound is healing and more granulation tissue is noted with some surrounding scab.  Incision is healing well with sutures intact without any drainage or pus or any evidence of dehiscence.  Trace edema to the toe there is no erythema or warmth or any obvious signs of infection. No pain with calf compression, swelling, warmth, erythema  Assessment: 56 year old female status post right foot bone spur excision  Plan: -All treatment options discussed with the patient including all alternatives, risks, complications.  -Sutures removed today without complications the incision remained well coapted.  The nailbed is also healing well with granulation tissue present.  Continue antibiotic ointment dressing changes daily. -Monitor for any clinical signs or symptoms of infection and directed to call the office immediately should any occur or go to the ER.  Trula Slade DPM

## 2020-12-06 ENCOUNTER — Encounter: Payer: Self-pay | Admitting: Podiatry

## 2020-12-06 ENCOUNTER — Ambulatory Visit (INDEPENDENT_AMBULATORY_CARE_PROVIDER_SITE_OTHER): Payer: 59 | Admitting: Podiatry

## 2020-12-06 ENCOUNTER — Other Ambulatory Visit: Payer: Self-pay

## 2020-12-06 DIAGNOSIS — Z9889 Other specified postprocedural states: Secondary | ICD-10-CM

## 2020-12-06 DIAGNOSIS — M779 Enthesopathy, unspecified: Secondary | ICD-10-CM

## 2020-12-11 NOTE — Progress Notes (Signed)
Subjective: 55 year old female presents the office today for follow-up evaluation after undergoing removal of spur dorsal hallux.  States that she is feeling well and the wound is healing.  She has not seen any drainage or pus.  No redness or warmth.  No significant pain.  She still wearing the surgical shoe. Denies any fevers, chills, nausea, vomiting.  No calf pain, chest pain, shortness of breath.  No other concerns today.   Objective: AAO x3, NAD DP/PT pulses palpable bilaterally, CRT less than 3 seconds The wound nail bed appears to be granular and there is no exposed bone.  The area is scabbed over.  There is no drainage or pus.  The incision the distal portion of toe with scab present.  There is no drainage or pus.  Minimal edema to the toe and there is no erythema or warmth. No pain with calf compression, swelling, warmth, erythema  Assessment: 55 year old female status post right foot bone spur excision  Plan: -All treatment options discussed with the patient including all alternatives, risks, complications.  -Incision of the wound is healing well without any signs of infection at this time.  There is no drainage.  Continue that small mount of antibiotic ointment dressing changes daily.  Continue surgical shoe for now to the scab has come off and we are sure that the underlying skin is intact.  Hopefully this week and next week and she can start transition to regular shoe as tolerated.  -Monitor for any clinical signs or symptoms of infection and directed to call the office immediately should any occur or go to the ER.  Trula Slade DPM

## 2020-12-20 ENCOUNTER — Encounter: Payer: Self-pay | Admitting: Podiatry

## 2020-12-20 ENCOUNTER — Ambulatory Visit (INDEPENDENT_AMBULATORY_CARE_PROVIDER_SITE_OTHER): Payer: 59 | Admitting: Podiatry

## 2020-12-20 ENCOUNTER — Other Ambulatory Visit: Payer: Self-pay

## 2020-12-20 DIAGNOSIS — Z9889 Other specified postprocedural states: Secondary | ICD-10-CM

## 2020-12-20 DIAGNOSIS — M779 Enthesopathy, unspecified: Secondary | ICD-10-CM

## 2020-12-20 DIAGNOSIS — L603 Nail dystrophy: Secondary | ICD-10-CM

## 2020-12-20 NOTE — Progress Notes (Signed)
Subjective: 55 year old female presents the office today for follow-up evaluation after undergoing removal of spur dorsal hallux.  States that she is returned back to wearing regular shoe.  She keeps a Band-Aid on the toe.  No significant pain.  Still some mild swelling to the toe but no redness or warmth.  No red streaks.  No other concerns today.    Objective: AAO x3, NAD DP/PT pulses palpable bilaterally, CRT less than 3 seconds The wound nail bed appears to be granular and there is no exposed bone.  Scab is present dorsal aspect incisions are healed with a scar.  There is mild edema to the toe but there is no erythema or warmth.  No drainage or pus.  No fluctuation crepitation.  No malodor.  No pain to the toe. No pain with calf compression, swelling, warmth, erythema       Assessment: 55 year old female status post right foot bone spur excision  Plan: -All treatment options discussed with the patient including all alternatives, risks, complications.  -Incisions are healing well.  She stepped on a regular shoe.  I continue to wash the area with soap and water daily and keep the bandage on the toe while wearing shoes.  Return in about 4 weeks (around 01/17/2021).  Trula Slade DPM

## 2021-01-17 ENCOUNTER — Ambulatory Visit (INDEPENDENT_AMBULATORY_CARE_PROVIDER_SITE_OTHER): Payer: 59 | Admitting: Podiatry

## 2021-01-17 ENCOUNTER — Other Ambulatory Visit: Payer: Self-pay

## 2021-01-17 DIAGNOSIS — Z9889 Other specified postprocedural states: Secondary | ICD-10-CM

## 2021-01-17 DIAGNOSIS — M779 Enthesopathy, unspecified: Secondary | ICD-10-CM

## 2021-01-17 DIAGNOSIS — L603 Nail dystrophy: Secondary | ICD-10-CM

## 2021-01-22 NOTE — Progress Notes (Signed)
Subjective: 55 year old female presents the office today for follow-up evaluation after undergoing removal of spur dorsal hallux.  Feels that the lateral side of the nail may be getting ingrown.  Denies any drainage or pus from swelling.  Incisions are healing well.  She has not seen any increase in swelling or any other issues with her toe.  Denies any fevers, chills, nausea, vomiting.   Objective: AAO x3, NAD DP/PT pulses palpable bilaterally, CRT less than 3 seconds Incisions distal aspect is well coapted without any dehiscence and scar is formed.  The wound bed is healed.  There is 1 area small scab that is well adhered but there is no drainage or pus edema, erythema.  The nail is ingrown along the medial aspect but is starting to grow out as well along the central, medial portion.  The nail continues to be mildly hypertrophic and dystrophic.  There is no drainage or pus or any obvious signs of infection. No pain with calf compression, swelling, warmth, erythema   Assessment: 55 year old female status post right foot bone spur excision  Plan: -All treatment options discussed with the patient including all alternatives, risks, complications.  -Potentially repeat the nail and complications in the nails now even straight across.  Discussed nail removal but she wants to hold off on this..  The wound, incision is healing well.  Currently no signs of infection.  Continue to monitor.  I would still keep a small amount of antibiotic ointment on the nail until the scab comes off completely. -Monitor for any clinical signs or symptoms of infection and directed to call the office immediately should any occur or go to the ER.  Return in about 2 months (around 03/20/2021).  Trula Slade DPM

## 2021-02-26 ENCOUNTER — Encounter: Payer: Self-pay | Admitting: Internal Medicine

## 2021-02-26 ENCOUNTER — Other Ambulatory Visit: Payer: Self-pay

## 2021-02-26 ENCOUNTER — Ambulatory Visit (INDEPENDENT_AMBULATORY_CARE_PROVIDER_SITE_OTHER): Payer: 59 | Admitting: Internal Medicine

## 2021-02-26 VITALS — BP 144/78 | HR 71 | Temp 98.6°F | Ht 63.4 in | Wt 222.0 lb

## 2021-02-26 DIAGNOSIS — Z6838 Body mass index (BMI) 38.0-38.9, adult: Secondary | ICD-10-CM

## 2021-02-26 DIAGNOSIS — Z0000 Encounter for general adult medical examination without abnormal findings: Secondary | ICD-10-CM | POA: Diagnosis not present

## 2021-02-26 DIAGNOSIS — I10 Essential (primary) hypertension: Secondary | ICD-10-CM | POA: Diagnosis not present

## 2021-02-26 DIAGNOSIS — Z23 Encounter for immunization: Secondary | ICD-10-CM | POA: Diagnosis not present

## 2021-02-26 LAB — POCT URINALYSIS DIPSTICK
Bilirubin, UA: NEGATIVE
Blood, UA: NEGATIVE
Glucose, UA: NEGATIVE
Ketones, UA: NEGATIVE
Nitrite, UA: NEGATIVE
Protein, UA: NEGATIVE
Spec Grav, UA: 1.025 (ref 1.010–1.025)
Urobilinogen, UA: 0.2 E.U./dL
pH, UA: 6 (ref 5.0–8.0)

## 2021-02-26 LAB — POCT UA - MICROALBUMIN
Albumin/Creatinine Ratio, Urine, POC: 30
Creatinine, POC: 200 mg/dL
Microalbumin Ur, POC: 30 mg/L

## 2021-02-26 MED ORDER — SHINGRIX 50 MCG/0.5ML IM SUSR: 0.5000 mL | Freq: Once | INTRAMUSCULAR | 0 refills | Status: AC

## 2021-02-26 MED ORDER — AMLODIPINE BESYLATE 2.5 MG PO TABS: ORAL_TABLET | ORAL | 11 refills | Status: DC

## 2021-02-26 MED ORDER — VALSARTAN-HYDROCHLOROTHIAZIDE 160-25 MG PO TABS: 1.0000 | ORAL_TABLET | Freq: Every day | ORAL | 2 refills | Status: DC

## 2021-02-26 NOTE — Patient Instructions (Signed)
Health Maintenance, Female Adopting a healthy lifestyle and getting preventive care are important in promoting health and wellness. Ask your health care provider about: The right schedule for you to have regular tests and exams. Things you can do on your own to prevent diseases and keep yourself healthy. What should I know about diet, weight, and exercise? Eat a healthy diet  Eat a diet that includes plenty of vegetables, fruits, low-fat dairy products, and lean protein. Do not eat a lot of foods that are high in solid fats, added sugars, or sodium.  Maintain a healthy weight Body mass index (BMI) is used to identify weight problems. It estimates body fat based on height and weight. Your health care provider can help determineyour BMI and help you achieve or maintain a healthy weight. Get regular exercise Get regular exercise. This is one of the most important things you can do for your health. Most adults should: Exercise for at least 150 minutes each week. The exercise should increase your heart rate and make you sweat (moderate-intensity exercise). Do strengthening exercises at least twice a week. This is in addition to the moderate-intensity exercise. Spend less time sitting. Even light physical activity can be beneficial. Watch cholesterol and blood lipids Have your blood tested for lipids and cholesterol at 56 years of age, then havethis test every 5 years. Have your cholesterol levels checked more often if: Your lipid or cholesterol levels are high. You are older than 56 years of age. You are at high risk for heart disease. What should I know about cancer screening? Depending on your health history and family history, you may need to have cancer screening at various ages. This may include screening for: Breast cancer. Cervical cancer. Colorectal cancer. Skin cancer. Lung cancer. What should I know about heart disease, diabetes, and high blood pressure? Blood pressure and heart  disease High blood pressure causes heart disease and increases the risk of stroke. This is more likely to develop in people who have high blood pressure readings, are of African descent, or are overweight. Have your blood pressure checked: Every 3-5 years if you are 18-39 years of age. Every year if you are 40 years old or older. Diabetes Have regular diabetes screenings. This checks your fasting blood sugar level. Have the screening done: Once every three years after age 40 if you are at a normal weight and have a low risk for diabetes. More often and at a younger age if you are overweight or have a high risk for diabetes. What should I know about preventing infection? Hepatitis B If you have a higher risk for hepatitis B, you should be screened for this virus. Talk with your health care provider to find out if you are at risk forhepatitis B infection. Hepatitis C Testing is recommended for: Everyone born from 1945 through 1965. Anyone with known risk factors for hepatitis C. Sexually transmitted infections (STIs) Get screened for STIs, including gonorrhea and chlamydia, if: You are sexually active and are younger than 56 years of age. You are older than 56 years of age and your health care provider tells you that you are at risk for this type of infection. Your sexual activity has changed since you were last screened, and you are at increased risk for chlamydia or gonorrhea. Ask your health care provider if you are at risk. Ask your health care provider about whether you are at high risk for HIV. Your health care provider may recommend a prescription medicine to help   prevent HIV infection. If you choose to take medicine to prevent HIV, you should first get tested for HIV. You should then be tested every 3 months for as long as you are taking the medicine. Pregnancy If you are about to stop having your period (premenopausal) and you may become pregnant, seek counseling before you get  pregnant. Take 400 to 800 micrograms (mcg) of folic acid every day if you become pregnant. Ask for birth control (contraception) if you want to prevent pregnancy. Osteoporosis and menopause Osteoporosis is a disease in which the bones lose minerals and strength with aging. This can result in bone fractures. If you are 65 years old or older, or if you are at risk for osteoporosis and fractures, ask your health care provider if you should: Be screened for bone loss. Take a calcium or vitamin D supplement to lower your risk of fractures. Be given hormone replacement therapy (HRT) to treat symptoms of menopause. Follow these instructions at home: Lifestyle Do not use any products that contain nicotine or tobacco, such as cigarettes, e-cigarettes, and chewing tobacco. If you need help quitting, ask your health care provider. Do not use street drugs. Do not share needles. Ask your health care provider for help if you need support or information about quitting drugs. Alcohol use Do not drink alcohol if: Your health care provider tells you not to drink. You are pregnant, may be pregnant, or are planning to become pregnant. If you drink alcohol: Limit how much you use to 0-1 drink a day. Limit intake if you are breastfeeding. Be aware of how much alcohol is in your drink. In the [place] one drink equals one 12 oz bottle of beer (355 mL), one 5 oz glass of wine (148 mL), or one 1 oz glass of hard liquor (44 mL). General instructions Schedule regular health, dental, and eye exams. Stay current with your vaccines. Tell your health care provider if: You often feel depressed. You have ever been abused or do not feel safe at home. Summary Adopting a healthy lifestyle and getting preventive care are important in promoting health and wellness. Follow your health care provider's instructions about healthy diet, exercising, and getting tested or screened for diseases. Follow your health care provider's  instructions on monitoring your cholesterol and blood pressure. This information is not intended to replace advice given to you by your health care provider. Make sure you discuss any questions you have with your healthcare provider. Document Revised: 06/16/2018 Document Reviewed: 06/16/2018 [employer] Patient Education  2022 [employer]  

## 2021-02-26 NOTE — Progress Notes (Signed)
I,Tianna Badgett,acting as a Education administrator for Maximino Greenland, MD.,have documented all relevant documentation on the behalf of Maximino Greenland, MD,as directed by  Maximino Greenland, MD while in the presence of Maximino Greenland, MD.  This visit occurred during the SARS-CoV-2 public health emergency.  Safety protocols were in place, including screening questions prior to the visit, additional usage of staff PPE, and extensive cleaning of exam room while observing appropriate contact time as indicated for disinfecting solutions.  Subjective:     Patient ID: Misty [name] , female    DOB: 02/20/1966 , 55 y.o.   MRN: [id number]   Chief Complaint  Patient presents with   Annual Exam   Hypertension    HPI  She is here today for a full physical examination. She is followed by Dr. Charlesetta Garibaldi for her GYN care.  She has no specific concerns or complaints at this time.   Hypertension This is a chronic problem. The current episode started more than 1 year ago. The problem has been gradually improving since onset. The problem is controlled. Pertinent negatives include no blurred vision, chest pain, headaches, palpitations or shortness of breath. Risk factors for coronary artery disease include obesity, post-menopausal state and sedentary lifestyle. Past treatments include diuretics and angiotensin blockers. The current treatment provides moderate improvement. Compliance problems include exercise.     Past Medical History:  Diagnosis Date   Hypertension    Morbid obesity due to excess calories ([place])      Family History  Problem Relation Age of Onset   Osteoporosis Mother    Breast cancer Sister      Current Outpatient Medications:    amLODipine (NORVASC) 2.5 MG tablet, One tab po qpm, Disp: 30 tablet, Rfl: 11   cetirizine (ZYRTEC) 10 MG tablet, Take 10 mg by mouth daily., Disp: , Rfl:    Magnesium Hydroxide (MAGNESIA PO), Take 1 tablet by mouth daily., Disp: , Rfl:    Multiple Vitamins-Minerals  (CENTRUM SILVER 50+WOMEN PO), Take by mouth., Disp: , Rfl:    Vitamin D, Ergocalciferol, (DRISDOL) 1.25 MG (50000 UNIT) CAPS capsule, One capsule po twice weekly on Tuesdays/Fridays, Disp: 24 capsule, Rfl: 1   Zoster Vaccine Adjuvanted (SHINGRIX) injection, Inject 0.5 mLs into the muscle once for 1 dose., Disp: 0.5 mL, Rfl: 0   valsartan-hydrochlorothiazide (DIOVAN HCT) 160-25 MG tablet, Take 1 tablet by mouth daily., Disp: 90 tablet, Rfl: 2   No Known Allergies    The patient states she uses none for birth control. Last LMP was No LMP recorded. Patient is postmenopausal.. Negative for Dysmenorrhea. Negative for: breast discharge, breast lump(s), breast pain and breast self exam. Associated symptoms include abnormal vaginal bleeding. Pertinent negatives include abnormal bleeding (hematology), anxiety, decreased libido, depression, difficulty falling sleep, dyspareunia, history of infertility, nocturia, sexual dysfunction, sleep disturbances, urinary incontinence, urinary urgency, vaginal discharge and vaginal itching. Diet regular.The patient states her exercise level is  minimal - she is healing from recent foot surgery.   . The patient's tobacco use is:  Social History   Tobacco Use  Smoking Status Never  Smokeless Tobacco Never  . She has been exposed to passive smoke. The patient's alcohol use is:  Social History   Substance and Sexual Activity  Alcohol Use No   Review of Systems  Constitutional: Negative.   HENT: Negative.    Eyes: Negative.  Negative for blurred vision.  Respiratory: Negative.  Negative for shortness of breath.   Cardiovascular: Negative.  Negative for chest  pain and palpitations.  Gastrointestinal: Negative.   Endocrine: Negative.   Genitourinary: Negative.   Musculoskeletal: Negative.   Skin: Negative.   Allergic/Immunologic: Negative.   Neurological: Negative.  Negative for headaches.  Hematological: Negative.   Psychiatric/Behavioral: Negative.       Today's Vitals   02/26/21 0938  BP: (!) 144/78  Pulse: 71  Temp: 98.6 F (37 C)  TempSrc: Oral  Weight: 222 lb (100.7 kg)  Height: 5' 3.4" (1.61 m)   Body mass index is 38.83 kg/m.  Wt Readings from Last 3 Encounters:  02/26/21 222 lb (100.7 kg)  09/13/20 216 lb 6.4 oz (98.2 kg)  05/29/20 210 lb 9.6 oz (95.5 kg)    Objective:  Physical Exam Vitals and nursing note reviewed.  Constitutional:      Appearance: Normal appearance.  HENT:     Head: Normocephalic and atraumatic.     Right Ear: Tympanic membrane, ear canal and external ear normal.     Left Ear: Tympanic membrane, ear canal and external ear normal.     Nose:     Comments: Masked     Mouth/Throat:     Comments: Masked  Eyes:     Extraocular Movements: Extraocular movements intact.     Conjunctiva/sclera: Conjunctivae normal.     Pupils: Pupils are equal, round, and reactive to light.  Cardiovascular:     Rate and Rhythm: Normal rate and regular rhythm.     Pulses: Normal pulses.     Heart sounds: Normal heart sounds.  Pulmonary:     Effort: Pulmonary effort is normal.     Breath sounds: Normal breath sounds.  Chest:  Breasts:    Tanner Score is 5.     Right: Normal.     Left: Normal.  Abdominal:     General: Bowel sounds are normal.     Palpations: Abdomen is soft.     Comments: Obese, soft. Difficult to assess organomegaly.   Genitourinary:    Comments: deferred Musculoskeletal:        General: Normal range of motion.     Cervical back: Normal range of motion and neck supple.  Feet:     Comments: R great toe w/ onycholysis. No erythema Skin:    General: Skin is warm and dry.  Neurological:     General: No focal deficit present.     Mental Status: She is alert and oriented to person, place, and time.  Psychiatric:        Mood and Affect: Mood normal.        Behavior: Behavior normal.        Assessment And Plan:     1. Routine general medical examination at a health care  facility Comments: A full exam was performed. Importance of monthly self breast exams was discussed with the patient. She will c/w Dr. Charlesetta Garibaldi for her GYN exams. PATIENT IS ADVISED TO GET 30-45 MINUTES REGULAR EXERCISE NO LESS THAN FOUR TO FIVE DAYS PER WEEK - BOTH WEIGHTBEARING EXERCISES AND AEROBIC ARE RECOMMENDED.  PATIENT IS ADVISED TO FOLLOW A HEALTHY DIET WITH AT LEAST SIX FRUITS/VEGGIES PER DAY, DECREASE INTAKE OF RED MEAT, AND TO INCREASE FISH INTAKE TO TWO DAYS PER WEEK.  MEATS/FISH SHOULD NOT BE FRIED, BAKED OR BROILED IS PREFERABLE.  IT IS ALSO IMPORTANT TO CUT BACK ON YOUR SUGAR INTAKE. PLEASE AVOID ANYTHING WITH ADDED SUGAR, CORN SYRUP OR OTHER SWEETENERS. IF YOU MUST USE A SWEETENER, YOU CAN TRY STEVIA. IT IS ALSO IMPORTANT TO  AVOID ARTIFICIALLY SWEETENERS AND DIET BEVERAGES. LASTLY, I SUGGEST WEARING SPF 50 SUNSCREEN ON EXPOSED PARTS AND ESPECIALLY WHEN IN THE DIRECT SUNLIGHT FOR AN EXTENDED PERIOD OF TIME.  PLEASE AVOID FAST FOOD RESTAURANTS AND INCREASE YOUR WATER INTAKE.  - CBC - Hemoglobin A1c - CMP14+EGFR - Lipid panel  2. Essential hypertension Comments: Uncontrolled. I will add amlodipine 2.43m daily. EKG performed, NSR w/o acute changes. She will rto in 3 weeks for a nurse visit. She is reminded to follow a low sodium diet.  - POCT Urinalysis Dipstick (81002) - POCT UA - Microalbumin - EKG 12-Lead - valsartan-hydrochlorothiazide (DIOVAN HCT) 160-25 MG tablet; Take 1 tablet by mouth daily.  Dispense: 90 tablet; Refill: 2  3. Class 2 severe obesity due to excess calories with serious comorbidity and body mass index (BMI) of 38.0 to 38.9 in adult ([facility] Comments: She is encouraged to strive for BMI less than 30 to decrease cardiac risk. Advised to aim for at least 150 minutes of exercise per week.  4. Immunization due Comments: I will send rx Shingrix to her local pharmacy.   Patient was given opportunity to ask questions. Patient verbalized understanding of the plan and was  able to repeat key elements of the plan. All questions were answered to their satisfaction.   I, RMaximino Greenland MD, have reviewed all documentation for this visit. The documentation on 02/26/21 for the exam, diagnosis, procedures, and orders are all accurate and complete.   THE PATIENT IS ENCOURAGED TO PRACTICE SOCIAL DISTANCING DUE TO THE COVID-19 PANDEMIC.

## 2021-02-27 LAB — CBC
Hematocrit: 38.4 % (ref 34.0–46.6)
Hemoglobin: 12.5 g/dL (ref 11.1–15.9)
MCH: 28.7 pg (ref 26.6–33.0)
MCHC: 32.6 g/dL (ref 31.5–35.7)
MCV: 88 fL (ref 79–97)
Platelets: 275 10*3/uL (ref 150–450)
RBC: 4.35 x10E6/uL (ref 3.77–5.28)
RDW: 12.6 % (ref 11.7–15.4)
WBC: 4.1 10*3/uL (ref 3.4–10.8)

## 2021-02-27 LAB — CMP14+EGFR
ALT: 12 IU/L (ref 0–32)
AST: 18 IU/L (ref 0–40)
Albumin/Globulin Ratio: 1.8 (ref 1.2–2.2)
Albumin: 4.5 g/dL (ref 3.8–4.9)
Alkaline Phosphatase: 88 IU/L (ref 44–121)
BUN/Creatinine Ratio: 10 (ref 9–23)
BUN: 9 mg/dL (ref 6–24)
Bilirubin Total: 0.4 mg/dL (ref 0.0–1.2)
CO2: 23 mmol/L (ref 20–29)
Calcium: 9.5 mg/dL (ref 8.7–10.2)
Chloride: 100 mmol/L (ref 96–106)
Creatinine, Ser: 0.9 mg/dL (ref 0.57–1.00)
Globulin, Total: 2.5 g/dL (ref 1.5–4.5)
Glucose: 99 mg/dL (ref 65–99)
Potassium: 4.3 mmol/L (ref 3.5–5.2)
Sodium: 140 mmol/L (ref 134–144)
Total Protein: 7 g/dL (ref 6.0–8.5)
eGFR: 75 mL/min/{1.73_m2} (ref >59)

## 2021-02-27 LAB — HEMOGLOBIN A1C
Est. average glucose Bld gHb Est-mCnc: 120 mg/dL
Hgb A1c MFr Bld: 5.8 % — ABNORMAL HIGH (ref 4.8–5.6)

## 2021-02-27 LAB — LIPID PANEL
Chol/HDL Ratio: 2.8 ratio (ref 0.0–4.4)
Cholesterol, Total: 169 mg/dL (ref 100–199)
HDL: 60 mg/dL (ref >39)
LDL Chol Calc (NIH): 96 mg/dL (ref 0–99)
Triglycerides: 69 mg/dL (ref 0–149)
VLDL Cholesterol Cal: 13 mg/dL (ref 5–40)

## 2021-03-19 ENCOUNTER — Ambulatory Visit: Payer: 59

## 2021-03-19 ENCOUNTER — Other Ambulatory Visit: Payer: Self-pay

## 2021-03-19 VITALS — BP 160/90 | HR 70 | Temp 98.0°F | Ht 63.2 in | Wt 224.0 lb

## 2021-03-19 DIAGNOSIS — I10 Essential (primary) hypertension: Secondary | ICD-10-CM

## 2021-03-19 MED ORDER — AMLODIPINE BESYLATE 5 MG PO TABS: 5.0000 mg | ORAL_TABLET | Freq: Every day | ORAL | 0 refills | Status: DC

## 2021-03-19 NOTE — Progress Notes (Signed)
Patient presents today for a nurse visit her bp was 144/78 p 71 at her last visit. She is taking valsartan HCTZ and amlodipine 2.'5mg'$  daily was added at her last visit. Dr.Sanders came and spoke with patient she advised her to check her bp daily. She is to take the amlodipine 2.'5mg'$  2 tablets daily until it runs out then she is to pick up amlodipine '5mg'$  from the pharmacy and she is advised to also take magnesium glycinate at bedtime. She is to f/u with Dr.Sanders in one month. YL,RMA

## 2021-03-21 ENCOUNTER — Other Ambulatory Visit: Payer: Self-pay

## 2021-03-21 ENCOUNTER — Ambulatory Visit (INDEPENDENT_AMBULATORY_CARE_PROVIDER_SITE_OTHER): Payer: 59 | Admitting: Podiatry

## 2021-03-21 DIAGNOSIS — M779 Enthesopathy, unspecified: Secondary | ICD-10-CM

## 2021-03-21 DIAGNOSIS — B351 Tinea unguium: Secondary | ICD-10-CM

## 2021-03-21 MED ORDER — EFINACONAZOLE 10 % EX SOLN
1.0000 [drp] | Freq: Every day | CUTANEOUS | 11 refills | Status: DC
Start: 2021-03-21 — End: 2023-06-01

## 2021-03-25 NOTE — Progress Notes (Signed)
Subjective: 55 year old female presents the office today for follow-up evaluation after undergoing removal of spur dorsal hallux.  She thinks the nail is growing out some but it is still thick and discolored.  She has no pain.  No swelling or redness or any drainage.  No other concerns.    Objective: AAO x3, NAD DP/PT pulses palpable bilaterally, CRT less than 3 seconds Incisions distal aspect is well coapted without any dehiscence and scar is formed.  The toenail itself does appear to have some clearing on the proximal nail fold but overall still hypertrophic, dystrophic with yellow-brown discoloration.  There is no pain.  There is no edema, erythema or any signs of infection. No pain with calf compression, swelling, warmth, erythema   Assessment: 55 year old female status post right foot bone spur excision; onychomycosis  Plan: -All treatment options discussed with the patient including all alternatives, risks, complications.  -From a procedure standpoint she is healed well however the nail itself is still growing out and has fungus noted.  We discussed oral, topical treatment for nail fungus.  She wants to start with topical.  We can try for Jublia which I ordered today.  Discussed side effects, success rates and duration of use.  Return in about 3 months (around 06/20/2021), or if symptoms worsen or fail to improve.  Trula Slade DPM

## 2021-04-18 ENCOUNTER — Ambulatory Visit: Payer: 59 | Admitting: Internal Medicine

## 2021-07-09 ENCOUNTER — Encounter: Payer: 59 | Admitting: Podiatry

## 2021-07-10 ENCOUNTER — Ambulatory Visit: Payer: 59 | Admitting: Internal Medicine

## 2021-07-18 ENCOUNTER — Encounter: Payer: 59 | Admitting: Podiatry

## 2021-08-01 ENCOUNTER — Encounter: Payer: 59 | Admitting: Podiatry

## 2021-08-01 ENCOUNTER — Ambulatory Visit: Payer: 59 | Admitting: Internal Medicine

## 2022-03-03 ENCOUNTER — Encounter: Payer: 59 | Admitting: Internal Medicine

## 2022-03-03 NOTE — Progress Notes (Deleted)
I,Misty [name],acting as a scribe for Misty Greenland, MD.,have documented all relevant documentation on the behalf of Misty Greenland, MD,as directed by  Misty Greenland, MD while in the presence of Misty Greenland, MD.   Subjective:     Patient ID: Misty [name] , female    DOB: 08-15-1965 , 56 y.o.   MRN: [id number]   Chief Complaint  Patient presents with   Annual Exam    HPI  She is here today for a full physical examination. She is followed by Dr. Charlesetta Garibaldi for her GYN care.  She has no specific concerns or complaints at this time.   Hypertension This is a chronic problem. The current episode started more than 1 year ago. The problem has been gradually improving since onset. The problem is controlled. Pertinent negatives include no blurred vision, chest pain, headaches, palpitations or shortness of breath. Risk factors for coronary artery disease include obesity, post-menopausal state and sedentary lifestyle. Past treatments include diuretics and angiotensin blockers. The current treatment provides moderate improvement. Compliance problems include exercise.      Past Medical History:  Diagnosis Date   Hypertension    Morbid obesity due to excess calories ([place])      Family History  Problem Relation Age of Onset   Osteoporosis Mother    Breast cancer Sister      Current Outpatient Medications:    amLODipine (NORVASC) 5 MG tablet, Take 1 tablet (5 mg total) by mouth daily., Disp: 90 tablet, Rfl: 0   cetirizine (ZYRTEC) 10 MG tablet, Take 10 mg by mouth daily., Disp: , Rfl:    Efinaconazole 10 % SOLN, Apply 1 drop topically daily., Disp: 4 mL, Rfl: 11   Magnesium Hydroxide (MAGNESIA PO), Take 1 tablet by mouth daily., Disp: , Rfl:    Multiple Vitamins-Minerals (CENTRUM SILVER 50+WOMEN PO), Take by mouth., Disp: , Rfl:    valsartan-hydrochlorothiazide (DIOVAN HCT) 160-25 MG tablet, Take 1 tablet by mouth daily., Disp: 90 tablet, Rfl: 2   Vitamin D, Ergocalciferol,  (DRISDOL) 1.25 MG (50000 UNIT) CAPS capsule, One capsule po twice weekly on Tuesdays/Fridays, Disp: 24 capsule, Rfl: 1   No Known Allergies    The patient states she uses {contraceptive methods:5051} for birth control. Last LMP was No LMP recorded. Patient is postmenopausal.. {Dysmenorrhea-menorrhagia:21918}. Negative for: breast discharge, breast lump(s), breast pain and breast self exam. Associated symptoms include abnormal vaginal bleeding. Pertinent negatives include abnormal bleeding (hematology), anxiety, decreased libido, depression, difficulty falling sleep, dyspareunia, history of infertility, nocturia, sexual dysfunction, sleep disturbances, urinary incontinence, urinary urgency, vaginal discharge and vaginal itching. Diet regular.The patient states her exercise level is    . The patient's tobacco use is:  Social History   Tobacco Use  Smoking Status Never  Smokeless Tobacco Never  . She has been exposed to passive smoke. The patient's alcohol use is:  Social History   Substance and Sexual Activity  Alcohol Use No  . Additional information: Last pap ***, next one scheduled for ***.    Review of Systems  Constitutional: Negative.   HENT: Negative.    Eyes: Negative.  Negative for blurred vision.  Respiratory: Negative.  Negative for shortness of breath.   Cardiovascular: Negative.  Negative for chest pain and palpitations.  Gastrointestinal: Negative.   Endocrine: Negative.   Genitourinary: Negative.   Musculoskeletal: Negative.   Skin: Negative.   Allergic/Immunologic: Negative.   Neurological: Negative.  Negative for headaches.  Hematological: Negative.   Psychiatric/Behavioral: Negative.  There were no vitals filed for this visit. There is no height or weight on file to calculate BMI.   Objective:  Physical Exam      Assessment And Plan:     1. Encounter for general adult medical examination w/o abnormal findings  2. Essential hypertension - POCT  Urinalysis Dipstick (81002) - Microalbumin / Creatinine Urine Ratio - EKG 12-Lead     Patient was given opportunity to ask questions. Patient verbalized understanding of the plan and was able to repeat key elements of the plan. All questions were answered to their satisfaction.   Debbora Dus, CMA   I, Debbora Dus, CMA, have reviewed all documentation for this visit. The documentation on 03/03/22 for the exam, diagnosis, procedures, and orders are all accurate and complete.   THE PATIENT IS ENCOURAGED TO PRACTICE SOCIAL DISTANCING DUE TO THE COVID-19 PANDEMIC.

## 2022-03-08 NOTE — Progress Notes (Signed)
No show

## 2022-03-15 ENCOUNTER — Other Ambulatory Visit: Payer: Self-pay | Admitting: Internal Medicine

## 2022-03-15 DIAGNOSIS — I10 Essential (primary) hypertension: Secondary | ICD-10-CM

## 2022-04-14 ENCOUNTER — Other Ambulatory Visit: Payer: Self-pay | Admitting: Internal Medicine

## 2022-04-14 DIAGNOSIS — I10 Essential (primary) hypertension: Secondary | ICD-10-CM

## 2022-06-08 ENCOUNTER — Ambulatory Visit
Admission: EM | Admit: 2022-06-08 | Discharge: 2022-06-08 | Disposition: A | Discharge status: Home / Self Care | Payer: 59 | Attending: Internal Medicine | Admitting: Internal Medicine

## 2022-06-08 DIAGNOSIS — I160 Hypertensive urgency: Secondary | ICD-10-CM | POA: Diagnosis not present

## 2022-06-08 DIAGNOSIS — I10 Essential (primary) hypertension: Secondary | ICD-10-CM

## 2022-06-08 DIAGNOSIS — R42 Dizziness and giddiness: Secondary | ICD-10-CM

## 2022-06-08 DIAGNOSIS — R6884 Jaw pain: Secondary | ICD-10-CM | POA: Diagnosis not present

## 2022-06-08 NOTE — ED Provider Notes (Signed)
[facility]    CSN: [id number] Arrival date & time: 06/08/22  1523      History   Chief Complaint Chief Complaint  Patient presents with   Dizziness    HPI Misty [name] is a 56 y.o. female.   Patient presents with dizziness and right-sided jaw pain that started this morning.  Patient is very concerned about her blood pressure as well as she took her blood pressure this morning when she woke up with dizziness and it was 425Z systolic.  She states it is typically 563 systolic.  She reports that she takes 2 different blood pressure medications but ran out of amlodipine approximately 1 month ago and has not been taking it.  She reports that she also has a frontal headache that is 5/10 on pain scale.  She reports that she has had migraines in the past but has been approximately 20 years.  She denies blurred vision, chest pain, shortness of breath, nausea, vomiting.  Denies any recent falls or head injuries.   Dizziness   Past Medical History:  Diagnosis Date   Hypertension    Morbid obesity due to excess calories [facility])     Patient Active Problem List   Diagnosis Date Noted   History of anemia 09/24/2020   Screening for malignant neoplasm of colon 09/24/2020   Hypertension 07/12/2012    Past Surgical History:  Procedure Laterality Date   CESAREAN SECTION     OOPHORECTOMY     r great toe surgery Right 11/06/2020    OB History     Gravida  2   Para  1   Term      Preterm      AB      Living  1      SAB      IAB      Ectopic      Multiple      Live Births               Home Medications    Prior to Admission medications   Medication Sig Start Date End Date Taking? Authorizing Provider  amLODipine (NORVASC) 5 MG tablet Take 1 tablet (5 mg total) by mouth daily. 03/19/21 03/19/22  Glendale Chard, MD  cetirizine (ZYRTEC) 10 MG tablet Take 10 mg by mouth daily.    [provider]  Efinaconazole 10 % SOLN Apply 1 drop  topically daily. 03/21/21   Trula Slade, DPM  Magnesium Hydroxide (MAGNESIA PO) Take 1 tablet by mouth daily.    [provider]  Multiple Vitamins-Minerals (CENTRUM SILVER 50+WOMEN PO) Take by mouth.    [provider]  valsartan-hydrochlorothiazide (DIOVAN-HCT) 160-25 MG tablet Take 1 tablet by mouth daily. PLEASE MAKE AN APPOINTMENT FOR FURTHER REFILLS 03/15/22   Glendale Chard, MD  Vitamin D, Ergocalciferol, (DRISDOL) 1.25 MG (50000 UNIT) CAPS capsule One capsule po twice weekly on Tuesdays/Fridays 02/24/20   Glendale Chard, MD    Family History Family History  Problem Relation Age of Onset   Osteoporosis Mother    Breast cancer Sister     Social History Social History   Tobacco Use   Smoking status: Never   Smokeless tobacco: Never  Vaping Use   Vaping Use: Never used  Substance Use Topics   Alcohol use: No   Drug use: No     Allergies   Patient has no known allergies.   Review of Systems Review of Systems Per HPI  Physical Exam Triage  Vital Signs ED Triage Vitals [06/08/22 1537]  Enc Vitals Group     BP (!) 180/105     Pulse Rate 67     Resp 16     Temp 98 F (36.7 C)     Temp Source Oral     SpO2 95 %     Weight      Height      Head Circumference      Peak Flow      Pain Score 3     Pain Loc      Pain Edu?      Excl. in [place]?    No data found.  Updated Vital Signs BP (!) 167/85   Pulse 67   Temp 98 F (36.7 C) (Oral)   Resp 16   SpO2 95%   Visual Acuity Right Eye Distance:   Left Eye Distance:   Bilateral Distance:    Right Eye Near:   Left Eye Near:    Bilateral Near:     Physical Exam Constitutional:      General: She is not in acute distress.    Appearance: Normal appearance. She is not toxic-appearing or diaphoretic.  HENT:     Head: Normocephalic and atraumatic.  Eyes:     Extraocular Movements: Extraocular movements intact.     Conjunctiva/sclera: Conjunctivae normal.     Pupils: Pupils are equal,  round, and reactive to light.  Cardiovascular:     Rate and Rhythm: Normal rate and regular rhythm.     Pulses: Normal pulses.     Heart sounds: Normal heart sounds.  Pulmonary:     Effort: Pulmonary effort is normal. No respiratory distress.     Breath sounds: Normal breath sounds.  Neurological:     General: No focal deficit present.     Mental Status: She is alert and oriented to person, place, and time. Mental status is at baseline.     Cranial Nerves: Cranial nerves 2-12 are intact.     Sensory: Sensation is intact.     Motor: Motor function is intact.     Coordination: Coordination is intact.     Gait: Gait is intact.  Psychiatric:        Mood and Affect: Mood normal.        Behavior: Behavior normal.        Thought Content: Thought content normal.        Judgment: Judgment normal.      UC Treatments / Results  Labs (all labs ordered are listed, but only abnormal results are displayed) Labs Reviewed - No data to display  EKG   Radiology No results found.  Procedures Procedures (including critical care time)  Medications Ordered in UC Medications - No data to display  Initial Impression / Assessment and Plan / UC Course  I have reviewed the triage vital signs and the nursing notes.  Pertinent labs & imaging results that were available during my care of the patient were reviewed by me and considered in my medical decision making (see chart for details).     Patient's blood pressure is significantly elevated.  Recheck was similar.  With blood pressure being significantly elevated and associated dizziness and jaw pain, this is concerning that patient needs a more extensive evaluation than can be provided at urgent care.  This is concerning for hypertensive urgency.  EKG was completed that was unremarkable when compared to previous EKGs.  Patient was advised to go to the ER  for further evaluation and management and was agreeable with plan.  Given EKG was normal and  neuro exam is normal, agree with patient self transport to the hospital. Final Clinical Impressions(s) / UC Diagnoses   Final diagnoses:  Dizziness and giddiness  Hypertensive urgency  Jaw pain     Discharge Instructions      Please go straight to the hospital as soon as you leave urgent care for further evaluation and management.    ED Prescriptions   None    PDMP not reviewed this encounter.   Teodora Medici, [place] 06/08/22 [phone]

## 2022-06-08 NOTE — ED Notes (Signed)
Patient is being discharged from the Urgent Care and sent to the Emergency Department via POV . Per Oswaldo Conroy NP, patient is in need of higher level of care due to HTN. Patient is aware and verbalizes understanding of plan of care.  Vitals:   06/08/22 1537 06/08/22 1538  BP: (!) 180/105 (!) 167/85  Pulse: 67   Resp: 16   Temp: 98 F (36.7 C)   SpO2: 95%

## 2022-06-08 NOTE — ED Triage Notes (Signed)
Pt c/o dizziness when waking up this morning. States she is having discomfort in the right shoulder and jaw. Concerned for elevated BP.

## 2022-06-08 NOTE — Discharge Instructions (Addendum)
Please go straight to the hospital as soon as you leave urgent care for further evaluation and management.

## 2022-06-09 ENCOUNTER — Encounter: Payer: Self-pay | Admitting: Nurse Practitioner

## 2022-06-09 ENCOUNTER — Ambulatory Visit: Payer: 59 | Admitting: Nurse Practitioner

## 2022-06-09 VITALS — BP 160/80 | HR 87 | Temp 98.3°F | Ht 63.0 in | Wt 219.2 lb

## 2022-06-09 DIAGNOSIS — Z23 Encounter for immunization: Secondary | ICD-10-CM | POA: Diagnosis not present

## 2022-06-09 DIAGNOSIS — E66812 Obesity, class 2: Secondary | ICD-10-CM | POA: Insufficient documentation

## 2022-06-09 DIAGNOSIS — I10 Essential (primary) hypertension: Secondary | ICD-10-CM | POA: Diagnosis not present

## 2022-06-09 DIAGNOSIS — H9201 Otalgia, right ear: Secondary | ICD-10-CM

## 2022-06-09 DIAGNOSIS — Z6838 Body mass index (BMI) 38.0-38.9, adult: Secondary | ICD-10-CM | POA: Diagnosis not present

## 2022-06-09 MED ORDER — AMLODIPINE BESYLATE 5 MG PO TABS: 5.0000 mg | ORAL_TABLET | Freq: Every day | ORAL | 1 refills | Status: DC

## 2022-06-09 MED ORDER — AMOXICILLIN 875 MG PO TABS: 875.0000 mg | ORAL_TABLET | Freq: Two times a day (BID) | ORAL | 0 refills | Status: DC

## 2022-06-09 MED ORDER — VALSARTAN-HYDROCHLOROTHIAZIDE 160-25 MG PO TABS: 1.0000 | ORAL_TABLET | Freq: Every day | ORAL | 0 refills | Status: DC

## 2022-06-09 NOTE — Patient Instructions (Addendum)
Hypertension, Adult High blood pressure (hypertension) is when the force of blood pumping through the arteries is too strong. The arteries are the blood vessels that carry blood from the heart throughout the body. Hypertension forces the heart to work harder to pump blood and may cause arteries to become narrow or stiff. Untreated or uncontrolled hypertension can lead to a heart attack, heart failure, a stroke, kidney disease, and other problems. A blood pressure reading consists of a higher number over a lower number. Ideally, your blood pressure should be below 120/80. The first ("top") number is called the systolic pressure. It is a measure of the pressure in your arteries as your heart beats. The second ("bottom") number is called the diastolic pressure. It is a measure of the pressure in your arteries as the heart relaxes. What are the causes? The exact cause of this condition is not known. There are some conditions that result in high blood pressure. What increases the risk? Certain factors may make you more likely to develop high blood pressure. Some of these risk factors are under your control, including: Smoking. Not getting enough exercise or physical activity. Being overweight. Having too much fat, sugar, calories, or salt (sodium) in your diet. Drinking too much alcohol. Other risk factors include: Having a personal history of heart disease, diabetes, high cholesterol, or kidney disease. Stress. Having a family history of high blood pressure and high cholesterol. Having obstructive sleep apnea. Age. The risk increases with age. What are the signs or symptoms? High blood pressure may not cause symptoms. Very high blood pressure (hypertensive crisis) may cause: Headache. Fast or irregular heartbeats (palpitations). Shortness of breath. Nosebleed. Nausea and vomiting. Vision changes. Severe chest pain, dizziness, and seizures. How is this diagnosed? This condition is diagnosed by  measuring your blood pressure while you are seated, with your arm resting on a flat surface, your legs uncrossed, and your feet flat on the floor. The cuff of the blood pressure monitor will be placed directly against the skin of your upper arm at the level of your heart. Blood pressure should be measured at least twice using the same arm. Certain conditions can cause a difference in blood pressure between your right and left arms. If you have a high blood pressure reading during one visit or you have normal blood pressure with other risk factors, you may be asked to: Return on a different day to have your blood pressure checked again. Monitor your blood pressure at home for 1 week or longer. If you are diagnosed with hypertension, you may have other blood or imaging tests to help your health care provider understand your overall risk for other conditions. How is this treated? This condition is treated by making healthy lifestyle changes, such as eating healthy foods, exercising more, and reducing your alcohol intake. You may be referred for counseling on a healthy diet and physical activity. Your health care provider may prescribe medicine if lifestyle changes are not enough to get your blood pressure under control and if: Your systolic blood pressure is above 130. Your diastolic blood pressure is above 80. Your personal target blood pressure may vary depending on your medical conditions, your age, and other factors. Follow these instructions at home: Eating and drinking  Eat a diet that is high in fiber and potassium, and low in sodium, added sugar, and fat. An example of this eating plan is called the DASH diet. DASH stands for Dietary Approaches to Stop Hypertension. To eat this way: Eat   plenty of fresh fruits and vegetables. Try to fill one half of your plate at each meal with fruits and vegetables. Eat whole grains, such as whole-wheat pasta, brown rice, or whole-grain bread. Fill about one  fourth of your plate with whole grains. Eat or drink low-fat dairy products, such as skim milk or low-fat yogurt. Avoid fatty cuts of meat, processed or cured meats, and poultry with skin. Fill about one fourth of your plate with lean proteins, such as fish, chicken without skin, beans, eggs, or tofu. Avoid pre-made and processed foods. These tend to be higher in sodium, added sugar, and fat. Reduce your daily sodium intake. Many people with hypertension should eat less than 1,500 mg of sodium a day. Do not drink alcohol if: Your health care provider tells you not to drink. You are pregnant, may be pregnant, or are planning to become pregnant. If you drink alcohol: Limit how much you have to: 0-1 drink a day for women. 0-2 drinks a day for men. Know how much alcohol is in your drink. In the [place] one drink equals one 12 oz bottle of beer (355 mL), one 5 oz glass of wine (148 mL), or one 1 oz glass of hard liquor (44 mL). Lifestyle  Work with your health care provider to maintain a healthy body weight or to lose weight. Ask what an ideal weight is for you. Get at least 30 minutes of exercise that causes your heart to beat faster (aerobic exercise) most days of the week. Activities may include walking, swimming, or biking. Include exercise to strengthen your muscles (resistance exercise), such as Pilates or lifting weights, as part of your weekly exercise routine. Try to do these types of exercises for 30 minutes at least 3 days a week. Do not use any products that contain nicotine or tobacco. These products include cigarettes, chewing tobacco, and vaping devices, such as e-cigarettes. If you need help quitting, ask your health care provider. Monitor your blood pressure at home as told by your health care provider. Keep all follow-up visits. This is important. Medicines Take over-the-counter and prescription medicines only as told by your health care provider. Follow directions carefully. Blood  pressure medicines must be taken as prescribed. Do not skip doses of blood pressure medicine. Doing this puts you at risk for problems and can make the medicine less effective. Ask your health care provider about side effects or reactions to medicines that you should watch for. Contact a health care provider if you: Think you are having a reaction to a medicine you are taking. Have headaches that keep coming back (recurring). Feel dizzy. Have swelling in your ankles. Have trouble with your vision. Get help right away if you: Develop a severe headache or confusion. Have unusual weakness or numbness. Feel faint. Have severe pain in your chest or abdomen. Vomit repeatedly. Have trouble breathing. These symptoms may be an emergency. Get help right away. Call 911. Do not wait to see if the symptoms will go away. Do not drive yourself to the hospital. Summary Hypertension is when the force of blood pumping through your arteries is too strong. If this condition is not controlled, it may put you at risk for serious complications. Your personal target blood pressure may vary depending on your medical conditions, your age, and other factors. For most people, a normal blood pressure is less than 120/80. Hypertension is treated with lifestyle changes, medicines, or a combination of both. Lifestyle changes include losing weight, eating a healthy,   low-sodium diet, exercising more, and limiting alcohol. This information is not intended to replace advice given to you by your health care provider. Make sure you discuss any questions you have with your health care provider. Document Revised: 04/30/2021 Document Reviewed: 04/30/2021 Elsevier Patient Education  2023 Elsevier Inc.  

## 2022-06-09 NOTE — Progress Notes (Signed)
Barnet Glasgow Martin,acting as a Education administrator for Minette Brine, FNP.,have documented all relevant documentation on the behalf of Minette Brine, FNP,as directed by  Minette Brine, FNP while in the presence of Minette Brine, [place].  Subjective:     Patient ID: Misty [name] , female    DOB: 08-07-1965 , 56 y.o.   MRN: [id number]   Chief Complaint  Patient presents with   Hypertension    HPI  Patient presents today for a BP check. Patient states yesterday her BP was really high, she went to urgent care and they recommend the hospital she states she did not stay at ER because it was too many people. Patient states when she went to urgent care her right arm was weak and right jaw pain, she was lightheaded and dizzy. She went home after leaving ER and took another Valsartan. Patient states she has been out of amLodipine for about 2 months. She is not taking her valsartan daily. She reports her blood pressure at home was 130's/80's.   She is also reporting her right arm is "weak" she has had a dislocated shoulder in the past. She will have aching with weather changing. She is right handed. Does not have an orthopedic.    Patient reports headache this morning.  BP Readings from Last 3 Encounters: 06/09/22 : (!) 160/84 06/08/22 : (!) 167/85 03/19/21 : (!) 160/90       Past Medical History:  Diagnosis Date   Hypertension    Morbid obesity due to excess calories ([place])      Family History  Problem Relation Age of Onset   Osteoporosis Mother    Breast cancer Sister      Current Outpatient Medications:    amoxicillin (AMOXIL) 875 MG tablet, Take 1 tablet (875 mg total) by mouth 2 (two) times daily., Disp: 14 tablet, Rfl: 0   cetirizine (ZYRTEC) 10 MG tablet, Take 10 mg by mouth daily., Disp: , Rfl:    Magnesium Hydroxide (MAGNESIA PO), Take 1 tablet by mouth daily., Disp: , Rfl:    Multiple Vitamins-Minerals (CENTRUM SILVER 50+WOMEN PO), Take by mouth., Disp: , Rfl:    Vitamin D,  Ergocalciferol, (DRISDOL) 1.25 MG (50000 UNIT) CAPS capsule, One capsule po twice weekly on Tuesdays/Fridays, Disp: 24 capsule, Rfl: 1   amLODipine (NORVASC) 5 MG tablet, Take 1 tablet (5 mg total) by mouth daily., Disp: 90 tablet, Rfl: 1   Efinaconazole 10 % SOLN, Apply 1 drop topically daily. (Patient not taking: Reported on 06/09/2022), Disp: 4 mL, Rfl: 11   valsartan-hydrochlorothiazide (DIOVAN-HCT) 160-25 MG tablet, Take 1 tablet by mouth daily., Disp: 30 tablet, Rfl: 0   No Known Allergies   Review of Systems   Today's Vitals   06/09/22 1239 06/09/22 1311  BP: (!) 160/84 (!) 160/80  Pulse: 87   Temp: 98.3 F (36.8 C)   TempSrc: Oral   Weight: 219 lb 3.2 oz (99.4 kg)   Height: _0  (1.6 m)   PainSc: 2    PainLoc: Head    Body mass index is 38.83 kg/m.  Wt Readings from Last 3 Encounters:  06/09/22 219 lb 3.2 oz (99.4 kg)  03/19/21 224 lb (101.6 kg)  02/26/21 222 lb (100.7 kg)    Objective:  Physical Exam Vitals reviewed.  Constitutional:      Appearance: She is well-developed.  HENT:     Head: Normocephalic and atraumatic.  Eyes:     Pupils: Pupils are equal, round, and reactive to light.  Cardiovascular:     Rate and Rhythm: Normal rate and regular rhythm.     Pulses: Normal pulses.     Heart sounds: Normal heart sounds. No murmur heard. Pulmonary:     Effort: Pulmonary effort is normal. No respiratory distress.     Breath sounds: Normal breath sounds. No wheezing.  Musculoskeletal:        General: No swelling or tenderness. Normal range of motion.     Comments: Right shoulder has decreased range of motion. Strength is equal bilaterally  Skin:    General: Skin is warm and dry.     Capillary Refill: Capillary refill takes less than 2 seconds.  Neurological:     General: No focal deficit present.     Mental Status: She is alert and oriented to person, place, and time.     Cranial Nerves: No cranial nerve deficit.  Psychiatric:        Mood and Affect: Mood  normal.         Assessment And Plan:     1. Essential hypertension Comments: Blood pressure is not well controlled, I restarted her valsartan. Advised in future if have similar symptoms to stay at ER to evaluate for stroke. Symptoms no - CMP14+EGFR - amLODipine (NORVASC) 5 MG tablet; Take 1 tablet (5 mg total) by mouth daily.  Dispense: 90 tablet; Refill: 1 - valsartan-hydrochlorothiazide (DIOVAN-HCT) 160-25 MG tablet; Take 1 tablet by mouth daily.  Dispense: 30 tablet; Refill: 0  2. Right ear pain Comments: Erythema noted to ear canal will treat for possible ear infection. - amoxicillin (AMOXIL) 875 MG tablet; Take 1 tablet (875 mg total) by mouth 2 (two) times daily.  Dispense: 14 tablet; Refill: 0  3. Class 2 severe obesity due to excess calories with serious comorbidity and body mass index (BMI) of 38.0 to 38.9 in adult [facility]) She is encouraged to strive for BMI less than 30 to decrease cardiac risk. Advised to aim for at least 150 minutes of exercise per week.  4. Need for Tdap vaccination Will give tetanus vaccine today while in office. Refer to order management. TDAP will be administered to adults 62-57 years old every 10 years. - Tdap vaccine greater than or equal to 7yo IM     Patient was given opportunity to ask questions. Patient verbalized understanding of the plan and was able to repeat key elements of the plan. All questions were answered to their satisfaction.  Minette Brine, FNP   I, Minette Brine, FNP, have reviewed all documentation for this visit. The documentation on 06/09/22 for the exam, diagnosis, procedures, and orders are all accurate and complete.   IF YOU HAVE BEEN REFERRED TO A SPECIALIST, IT MAY TAKE 1-2 WEEKS TO SCHEDULE/PROCESS THE REFERRAL. IF YOU HAVE NOT HEARD FROM US/SPECIALIST IN TWO WEEKS, PLEASE GIVE [place] A CALL AT [phone] X [phone].   THE PATIENT IS ENCOURAGED TO PRACTICE SOCIAL DISTANCING DUE TO THE COVID-19 PANDEMIC.

## 2022-06-12 LAB — CMP14+EGFR
ALT: 14 IU/L (ref 0–32)
AST: 18 IU/L (ref 0–40)
Albumin/Globulin Ratio: 1.4 (ref 1.2–2.2)
Albumin: 4.7 g/dL (ref 3.8–4.9)
Alkaline Phosphatase: 96 IU/L (ref 44–121)
BUN/Creatinine Ratio: 12 (ref 9–23)
BUN: 11 mg/dL (ref 6–24)
Bilirubin Total: <0.2 mg/dL (ref 0.0–1.2)
CO2: 20 mmol/L (ref 20–29)
Calcium: 10 mg/dL (ref 8.7–10.2)
Chloride: 105 mmol/L (ref 96–106)
Creatinine, Ser: 0.91 mg/dL (ref 0.57–1.00)
Globulin, Total: 3.4 g/dL (ref 1.5–4.5)
Glucose: 94 mg/dL (ref 70–99)
Potassium: 4.4 mmol/L (ref 3.5–5.2)
Sodium: 147 mmol/L — ABNORMAL HIGH (ref 134–144)
Total Protein: 8.1 g/dL (ref 6.0–8.5)
eGFR: 74 mL/min/{1.73_m2} (ref >59)

## 2022-07-01 ENCOUNTER — Other Ambulatory Visit: Payer: Self-pay | Admitting: Nurse Practitioner

## 2022-07-01 DIAGNOSIS — I10 Essential (primary) hypertension: Secondary | ICD-10-CM

## 2022-08-04 ENCOUNTER — Telehealth: Payer: 59 | Admitting: Physician Assistant

## 2022-08-04 DIAGNOSIS — B9689 Other specified bacterial agents as the cause of diseases classified elsewhere: Secondary | ICD-10-CM

## 2022-08-04 DIAGNOSIS — J069 Acute upper respiratory infection, unspecified: Secondary | ICD-10-CM | POA: Diagnosis not present

## 2022-08-04 MED ORDER — AMOXICILLIN-POT CLAVULANATE 875-125 MG PO TABS: 1.0000 | ORAL_TABLET | Freq: Two times a day (BID) | ORAL | 0 refills | Status: DC

## 2022-08-04 MED ORDER — PROMETHAZINE-DM 6.25-15 MG/5ML PO SYRP: 5.0000 mL | ORAL_SOLUTION | Freq: Four times a day (QID) | ORAL | 0 refills | Status: DC | PRN

## 2022-08-04 NOTE — Patient Instructions (Signed)
Jacolyn Reedy, thank you for joining Mar Daring, PA-C for today's virtual visit.  While this provider is not your primary care provider (PCP), if your PCP is located in our provider database this encounter information will be shared with them immediately following your visit.   [facility] account gives you access to today's visit and all your visits, tests, and labs performed at [facility] " click here if you don't have a [place] account or go to [email or web address]  Consent: (Patient) Misty [name] provided verbal consent for this virtual visit at the beginning of the encounter.  Current Medications:  Current Outpatient Medications:    amoxicillin-clavulanate (AUGMENTIN) 875-125 MG tablet, Take 1 tablet by mouth 2 (two) times daily., Disp: 14 tablet, Rfl: 0   promethazine-dextromethorphan (PROMETHAZINE-DM) 6.25-15 MG/5ML syrup, Take 5 mLs by mouth 4 (four) times daily as needed for cough., Disp: 118 mL, Rfl: 0   amLODipine (NORVASC) 5 MG tablet, Take 1 tablet (5 mg total) by mouth daily., Disp: 90 tablet, Rfl: 1   cetirizine (ZYRTEC) 10 MG tablet, Take 10 mg by mouth daily., Disp: , Rfl:    Efinaconazole 10 % SOLN, Apply 1 drop topically daily. (Patient not taking: Reported on 06/09/2022), Disp: 4 mL, Rfl: 11   Magnesium Hydroxide (MAGNESIA PO), Take 1 tablet by mouth daily., Disp: , Rfl:    Multiple Vitamins-Minerals (CENTRUM SILVER 50+WOMEN PO), Take by mouth., Disp: , Rfl:    valsartan-hydrochlorothiazide (DIOVAN-HCT) 160-25 MG tablet, TAKE 1 TABLET BY MOUTH EVERY DAY, Disp: 90 tablet, Rfl: 1   Vitamin D, Ergocalciferol, (DRISDOL) 1.25 MG (50000 UNIT) CAPS capsule, One capsule po twice weekly on Tuesdays/Fridays, Disp: 24 capsule, Rfl: 1   Medications ordered in this encounter:  Meds ordered this encounter  Medications   amoxicillin-clavulanate (AUGMENTIN) 875-125 MG tablet    Sig: Take 1 tablet by mouth 2 (two) times  daily.    Dispense:  14 tablet    Refill:  0    Order Specific Question:   Supervising Provider    Answer:   Chase Picket [id number]   promethazine-dextromethorphan (PROMETHAZINE-DM) 6.25-15 MG/5ML syrup    Sig: Take 5 mLs by mouth 4 (four) times daily as needed for cough.    Dispense:  118 mL    Refill:  0    Order Specific Question:   Supervising Provider    Answer:   Chase Picket [[id number]]     *If you need refills on other medications prior to your next appointment, please contact your pharmacy*  Follow-Up: Call back or seek an in-person evaluation if the symptoms worsen or if the condition fails to improve as anticipated.  [place] [phone]  Other Instructions Upper Respiratory Infection, Adult An upper respiratory infection (URI) is a common viral infection of the nose, throat, and upper air passages that lead to the lungs. The most common type of URI is the common cold. URIs usually get better on their own, without medical treatment. What are the causes? A URI is caused by a virus. You may catch a virus by: Breathing in droplets from an infected person's cough or sneeze. Touching something that has been exposed to the virus (is contaminated) and then touching your mouth, nose, or eyes. What increases the risk? You are more likely to get a URI if: You are very young or very old. You have close contact with others, such as at work, school, or a health care facility.  You smoke. You have long-term (chronic) heart or lung disease. You have a weakened disease-fighting system (immune system). You have nasal allergies or asthma. You are experiencing a lot of stress. You have poor nutrition. What are the signs or symptoms? A URI usually involves some of the following symptoms: Runny or stuffy (congested) nose. Cough. Sneezing. Sore throat. Headache. Fatigue. Fever. Loss of appetite. Pain in your forehead, behind your eyes, and over your  cheekbones (sinus pain). Muscle aches. Redness or irritation of the eyes. Pressure in the ears or face. How is this diagnosed? This condition may be diagnosed based on your medical history and symptoms, and a physical exam. Your health care provider may use a swab to take a mucus sample from your nose (nasal swab). This sample can be tested to determine what virus is causing the illness. How is this treated? URIs usually get better on their own within 7-10 days. Medicines cannot cure URIs, but your health care provider may recommend certain medicines to help relieve symptoms, such as: Over-the-counter cold medicines. Cough suppressants. Coughing is a type of defense against infection that helps to clear the respiratory system, so take these medicines only as recommended by your health care provider. Fever-reducing medicines. Follow these instructions at home: Activity Rest as needed. If you have a fever, stay home from work or school until your fever is gone or until your health care provider says your URI cannot spread to other people (is no longer contagious). Your health care provider may have you wear a face mask to prevent your infection from spreading. Relieving symptoms Gargle with a mixture of salt and water 3-4 times a day or as needed. To make salt water, completely dissolve -1 tsp (3-6 g) of salt in 1 cup (237 mL) of warm water. Use a cool-mist humidifier to add moisture to the air. This can help you breathe more easily. Eating and drinking  Drink enough fluid to keep your urine pale yellow. Eat soups and other clear broths. General instructions  Take over-the-counter and prescription medicines only as told by your health care provider. These include cold medicines, fever reducers, and cough suppressants. Do not use any products that contain nicotine or tobacco. These products include cigarettes, chewing tobacco, and vaping devices, such as e-cigarettes. If you need help  quitting, ask your health care provider. Stay away from secondhand smoke. Stay up to date on all immunizations, including the yearly (annual) flu vaccine. Keep all follow-up visits. This is important. How to prevent the spread of infection to others URIs can be contagious. To prevent the infection from spreading: Wash your hands with soap and water for at least 20 seconds. If soap and water are not available, use hand sanitizer. Avoid touching your mouth, face, eyes, or nose. Cough or sneeze into a tissue or your sleeve or elbow instead of into your hand or into the air.  Contact a health care provider if: You are getting worse instead of better. You have a fever or chills. Your mucus is brown or red. You have yellow or brown discharge coming from your nose. You have pain in your face, especially when you bend forward. You have swollen neck glands. You have pain while swallowing. You have white areas in the back of your throat. Get help right away if: You have shortness of breath that gets worse. You have severe or persistent: Headache. Ear pain. Sinus pain. Chest pain. You have chronic lung disease along with any of the following: Making  high-pitched whistling sounds when you breathe, most often when you breathe out (wheezing). Prolonged cough (more than 14 days). Coughing up blood. A change in your usual mucus. You have a stiff neck. You have changes in your: Vision. Hearing. Thinking. Mood. These symptoms may be an emergency. Get help right away. Call 911. Do not wait to see if the symptoms will go away. Do not drive yourself to the hospital. Summary An upper respiratory infection (URI) is a common infection of the nose, throat, and upper air passages that lead to the lungs. A URI is caused by a virus. URIs usually get better on their own within 7-10 days. Medicines cannot cure URIs, but your health care provider may recommend certain medicines to help relieve  symptoms. This information is not intended to replace advice given to you by your health care provider. Make sure you discuss any questions you have with your health care provider. Document Revised: 01/23/2021 Document Reviewed: 01/23/2021 Elsevier Patient Education  [place].    If you have been instructed to have an in-person evaluation today at a local Urgent Care facility, please use the link below. It will take you to a list of all of our available [facility], including address, phone number and hours of operation. Please do not delay care.  [facility]  If you or a family member do not have a primary care provider, use the link below to schedule a visit and establish care. When you choose a [facility] primary care physician or advanced practice provider, you gain a long-term partner in health. Find a Primary Care Provider  Learn more about [facility]'s in-office and virtual care options: [facility]

## 2022-08-04 NOTE — Progress Notes (Signed)
Virtual Visit Consent   EMRI SAMPLE, you are scheduled for a virtual visit with a [facility] provider today. Just as with appointments in the office, your consent must be obtained to participate. Your consent will be active for this visit and any virtual visit you may have with one of our providers in the next 365 days. If you have a MyChart account, a copy of this consent can be sent to you electronically.  As this is a virtual visit, video technology does not allow for your provider to perform a traditional examination. This may limit your provider's ability to fully assess your condition. If your provider identifies any concerns that need to be evaluated in person or the need to arrange testing (such as labs, EKG, etc.), we will make arrangements to do so. Although advances in technology are sophisticated, we cannot ensure that it will always work on either your end or our end. If the connection with a video visit is poor, the visit may have to be switched to a telephone visit. With either a video or telephone visit, we are not always able to ensure that we have a secure connection.  By engaging in this virtual visit, you consent to the provision of healthcare and authorize for your insurance to be billed (if applicable) for the services provided during this visit. Depending on your insurance coverage, you may receive a charge related to this service.  I need to obtain your verbal consent now. Are you willing to proceed with your visit today? Misty [name] has provided verbal consent on 08/04/2022 for a virtual visit (video or telephone). Mar Daring, PA-C  Date: 08/04/2022 2:36 PM  Virtual Visit via Video Note   I, Mar Daring, connected with  Misty [name]  ([id number], 04/11/1966) on 08/04/22 at  2:30 PM EST by a video-enabled telemedicine application and verified that I am speaking with the correct person using two identifiers.  Location: Patient: Virtual  Visit Location Patient: Other: work; isolated Provider: Scientist, research (medical) Provider: Home Office   I discussed the limitations of evaluation and management by telemedicine and the availability of in person appointments. The patient expressed understanding and agreed to proceed.    History of Present Illness: Misty [name] is a 57 y.o. who identifies as a female who was assigned female at birth, and is being seen today for URI symptoms.  HPI: URI  This is a new problem. The current episode started in the past 7 days (Wednesday, 07/30/22). There has been no fever. Associated symptoms include congestion, coughing (productive), headaches, rhinorrhea, sinus pain and a sore throat. Pertinent negatives include no diarrhea, ear pain, nausea, plugged ear sensation or vomiting. Treatments tried: mucinex, flonase. The treatment provided no relief.  At home covid 19 testing is negative  Does report she did a virtual visit with another group (not available in Epic or CareEverywhere) on Friday, 08/01/22, and was told to continue symptomatic management at that time. She reports she has been doing this and feels she has continued to worsen instead of improve.   Problems:  Patient Active Problem List   Diagnosis Date Noted   Class 2 severe obesity due to excess calories with serious comorbidity and body mass index (BMI) of 38.0 to 38.9 in adult [facility]) 06/09/2022   History of anemia 09/24/2020   Screening for malignant neoplasm of colon 09/24/2020   Hypertension 07/12/2012    Allergies: No Known Allergies Medications:  Current Outpatient Medications:  amoxicillin-clavulanate (AUGMENTIN) 875-125 MG tablet, Take 1 tablet by mouth 2 (two) times daily., Disp: 14 tablet, Rfl: 0   promethazine-dextromethorphan (PROMETHAZINE-DM) 6.25-15 MG/5ML syrup, Take 5 mLs by mouth 4 (four) times daily as needed for cough., Disp: 118 mL, Rfl: 0   amLODipine (NORVASC) 5 MG tablet, Take 1 tablet (5 mg total) by mouth  daily., Disp: 90 tablet, Rfl: 1   cetirizine (ZYRTEC) 10 MG tablet, Take 10 mg by mouth daily., Disp: , Rfl:    Efinaconazole 10 % SOLN, Apply 1 drop topically daily. (Patient not taking: Reported on 06/09/2022), Disp: 4 mL, Rfl: 11   Magnesium Hydroxide (MAGNESIA PO), Take 1 tablet by mouth daily., Disp: , Rfl:    Multiple Vitamins-Minerals (CENTRUM SILVER 50+WOMEN PO), Take by mouth., Disp: , Rfl:    valsartan-hydrochlorothiazide (DIOVAN-HCT) 160-25 MG tablet, TAKE 1 TABLET BY MOUTH EVERY DAY, Disp: 90 tablet, Rfl: 1   Vitamin D, Ergocalciferol, (DRISDOL) 1.25 MG (50000 UNIT) CAPS capsule, One capsule po twice weekly on Tuesdays/Fridays, Disp: 24 capsule, Rfl: 1   Observations/Objective: Patient is well-developed, well-nourished in no acute distress.  Resting comfortably at home.  Head is normocephalic, atraumatic.  No labored breathing.  Speech is clear and coherent with logical content.  Patient is alert and oriented at baseline.  Dry cough heard x twice on call   Assessment and Plan: 1. Bacterial upper respiratory infection - amoxicillin-clavulanate (AUGMENTIN) 875-125 MG tablet; Take 1 tablet by mouth 2 (two) times daily.  Dispense: 14 tablet; Refill: 0 - promethazine-dextromethorphan (PROMETHAZINE-DM) 6.25-15 MG/5ML syrup; Take 5 mLs by mouth 4 (four) times daily as needed for cough.  Dispense: 118 mL; Refill: 0  - Worsening over a week despite OTC medications - Will treat with Augmentin and Promethazine DM - Can continue Mucinex  - Push fluids.  - Rest.  - Steam and humidifier can help - Seek in person evaluation if worsening or symptoms fail to improve    Follow Up Instructions: I discussed the assessment and treatment plan with the patient. The patient was provided an opportunity to ask questions and all were answered. The patient agreed with the plan and demonstrated an understanding of the instructions.  A copy of instructions were sent to the patient via MyChart unless  otherwise noted below.    The patient was advised to call back or seek an in-person evaluation if the symptoms worsen or if the condition fails to improve as anticipated.  Time:  I spent 10 minutes with the patient via telehealth technology discussing the above problems/concerns.    Mar Daring, PA-C

## 2022-09-22 ENCOUNTER — Ambulatory Visit
Admission: EM | Admit: 2022-09-22 | Discharge: 2022-09-22 | Disposition: A | Discharge status: Home / Self Care | Payer: 59 | Attending: Internal Medicine | Admitting: Internal Medicine

## 2022-09-22 DIAGNOSIS — U071 COVID-19: Secondary | ICD-10-CM

## 2022-09-22 LAB — SARS CORONAVIRUS 2 (TAT 6-24 HRS): SARS Coronavirus 2: POSITIVE — AB

## 2022-09-22 MED ORDER — MOLNUPIRAVIR EUA 200MG CAPSULE: 4.0000 | ORAL_CAPSULE | Freq: Two times a day (BID) | ORAL | 0 refills | Status: AC

## 2022-09-22 MED ORDER — BENZONATATE 100 MG PO CAPS: 100.0000 mg | ORAL_CAPSULE | Freq: Three times a day (TID) | ORAL | 0 refills | Status: DC | PRN

## 2022-09-22 NOTE — Discharge Instructions (Signed)
Covid test is pending.  We will call if it is positive.  I have prescribed you COVID antiviral and a cough medication.  Please follow-up if any symptoms persist or worsen.

## 2022-09-22 NOTE — ED Provider Notes (Signed)
[facility]    CSN: [id number] Arrival date & time: 09/22/22  1017      History   Chief Complaint Chief Complaint  Patient presents with   Covid Positive    HPI Misty [name] is a 57 y.o. female.   Patient presents with 3-day history of nasal drainage, nasal congestion, headache, cough.  Reports she took a home COVID test today that was positive.  Although, she would like repeat testing given these COVID tests were expired.  She reports that COVID has been going around her workplace for several weeks.  She denies any fever at home.  Denies chest pain, shortness of breath, gastrointestinal symptoms.  Denies history of asthma or COPD.  Has taken several different over-the-counter cold and flu medications for symptoms.     Past Medical History:  Diagnosis Date   Hypertension    Morbid obesity due to excess calories [facility])     Patient Active Problem List   Diagnosis Date Noted   Class 2 severe obesity due to excess calories with serious comorbidity and body mass index (BMI) of 38.0 to 38.9 in adult [facility]) 06/09/2022   History of anemia 09/24/2020   Screening for malignant neoplasm of colon 09/24/2020   Hypertension 07/12/2012    Past Surgical History:  Procedure Laterality Date   CESAREAN SECTION     OOPHORECTOMY     r great toe surgery Right 11/06/2020    OB History     Gravida  2   Para  1   Term      Preterm      AB      Living  1      SAB      IAB      Ectopic      Multiple      Live Births               Home Medications    Prior to Admission medications   Medication Sig Start Date End Date Taking? Authorizing Provider  benzonatate (TESSALON) 100 MG capsule Take 1 capsule (100 mg total) by mouth every 8 (eight) hours as needed for cough. 09/22/22  Yes Silvano Garofano, [place] E, FNP  molnupiravir EUA (LAGEVRIO) 200 mg CAPS capsule Take 4 capsules (800 mg total) by mouth 2 (two) times daily for 5 days. 09/22/22 09/27/22 Yes Jalexia Lalli,  Michele Rockers, FNP  amLODipine (NORVASC) 5 MG tablet Take 1 tablet (5 mg total) by mouth daily. 06/09/22 06/09/23  Minette Brine, FNP  cetirizine (ZYRTEC) 10 MG tablet Take 10 mg by mouth daily.    [provider]  Efinaconazole 10 % SOLN Apply 1 drop topically daily. Patient not taking: Reported on 06/09/2022 03/21/21   Trula Slade, DPM  Magnesium Hydroxide (MAGNESIA PO) Take 1 tablet by mouth daily.    [provider]  Multiple Vitamins-Minerals (CENTRUM SILVER 50+WOMEN PO) Take by mouth.    [provider]  promethazine-dextromethorphan (PROMETHAZINE-DM) 6.25-15 MG/5ML syrup Take 5 mLs by mouth 4 (four) times daily as needed for cough. 08/04/22   Mar Daring, PA-C  valsartan-hydrochlorothiazide (DIOVAN-HCT) 160-25 MG tablet TAKE 1 TABLET BY MOUTH EVERY DAY 07/02/22   Minette Brine, FNP  Vitamin D, Ergocalciferol, (DRISDOL) 1.25 MG (50000 UNIT) CAPS capsule One capsule po twice weekly on Tuesdays/Fridays 02/24/20   [place] Chard, MD    Family History Family History  Problem Relation Age of Onset   Osteoporosis Mother    Breast cancer Sister  Social History Social History   Tobacco Use   Smoking status: Never   Smokeless tobacco: Never  Vaping Use   Vaping Use: Never used  Substance Use Topics   Alcohol use: No   Drug use: No     Allergies   Patient has no known allergies.   Review of Systems Review of Systems Per HPI  Physical Exam Triage Vital Signs ED Triage Vitals  Enc Vitals Group     BP 09/22/22 1032 (!) 152/84     Pulse Rate 09/22/22 1030 70     Resp 09/22/22 1030 18     Temp 09/22/22 1030 98.7 F (37.1 C)     Temp Source 09/22/22 1030 Oral     SpO2 09/22/22 1030 95 %     Weight --      Height --      Head Circumference --      Peak Flow --      Pain Score 09/22/22 1030 2     Pain Loc --      Pain Edu? --      Excl. in [place]? --    No data found.  Updated Vital Signs BP (!) 152/84 (BP Location: Left Arm)   Pulse  70   Temp 98.7 F (37.1 C) (Oral)   Resp 18   SpO2 95%   Visual Acuity Right Eye Distance:   Left Eye Distance:   Bilateral Distance:    Right Eye Near:   Left Eye Near:    Bilateral Near:     Physical Exam Constitutional:      General: She is not in acute distress.    Appearance: Normal appearance. She is not toxic-appearing or diaphoretic.  HENT:     Head: Normocephalic and atraumatic.     Right Ear: Tympanic membrane and ear canal normal.     Left Ear: Tympanic membrane and ear canal normal.     Nose: Congestion present.     Mouth/Throat:     Mouth: Mucous membranes are moist.     Pharynx: No posterior oropharyngeal erythema.  Eyes:     Extraocular Movements: Extraocular movements intact.     Conjunctiva/sclera: Conjunctivae normal.     Pupils: Pupils are equal, round, and reactive to light.  Cardiovascular:     Rate and Rhythm: Normal rate and regular rhythm.     Pulses: Normal pulses.     Heart sounds: Normal heart sounds.  Pulmonary:     Effort: Pulmonary effort is normal. No respiratory distress.     Breath sounds: Normal breath sounds. No stridor. No wheezing, rhonchi or rales.  Abdominal:     General: Abdomen is flat. Bowel sounds are normal.     Palpations: Abdomen is soft.  Musculoskeletal:        General: Normal range of motion.     Cervical back: Normal range of motion.  Skin:    General: Skin is warm and dry.  Neurological:     General: No focal deficit present.     Mental Status: She is alert and oriented to person, place, and time. Mental status is at baseline.  Psychiatric:        Mood and Affect: Mood normal.        Behavior: Behavior normal.      UC Treatments / Results  Labs (all labs ordered are listed, but only abnormal results are displayed) Labs Reviewed  SARS CORONAVIRUS 2 (TAT 6-24 HRS)    EKG  Radiology No results found.  Procedures Procedures (including critical care time)  Medications Ordered in UC Medications - No  data to display  Initial Impression / Assessment and Plan / UC Course  I have reviewed the triage vital signs and the nursing notes.  Pertinent labs & imaging results that were available during my care of the patient were reviewed by me and considered in my medical decision making (see chart for details).     Patient tested positive for COVID-19 with an at home test.  Patient requested COVID-19 testing to be repeated.  Despite home COVID test being expired, this is most likely COVID-19.  Therefore, will treat for COVID.  Patient wishes to be treated with molnupiravir COVID antiviral given she has taken this before in the past for COVID and tolerated well with improvement of symptoms.  This was prescribed for patient and patient advised to start taking this immediately given she is currently inside the treatment window.  Cough medication also prescribed for patient.  Advised supportive care and symptom management.  Discussed return precautions.  Patient verbalized understanding and was agreeable with plan. Final Clinical Impressions(s) / UC Diagnoses   Final diagnoses:  [id number]     Discharge Instructions      Covid test is pending.  We will call if it is positive.  I have prescribed you COVID antiviral and a cough medication.  Please follow-up if any symptoms persist or worsen.    ED Prescriptions     Medication Sig Dispense Auth. Provider   molnupiravir EUA (LAGEVRIO) 200 mg CAPS capsule Take 4 capsules (800 mg total) by mouth 2 (two) times daily for 5 days. 40 capsule [place], [place] E, [place]   benzonatate (TESSALON) 100 MG capsule Take 1 capsule (100 mg total) by mouth every 8 (eight) hours as needed for cough. 21 capsule [place], Michele Rockers, [place]      PDMP not reviewed this encounter.   Teodora Medici, [place] 09/22/22 1048

## 2022-09-22 NOTE — ED Triage Notes (Signed)
Pt c/o nasal drainage, headache, cough,   Onset ~ Saturday   Reports covid(+) home test today but says she wants to be sure because "the test was old."   Reports she has not taken her htn medications today yet.

## 2022-10-07 ENCOUNTER — Other Ambulatory Visit: Payer: Self-pay

## 2022-10-09 ENCOUNTER — Ambulatory Visit (INDEPENDENT_AMBULATORY_CARE_PROVIDER_SITE_OTHER): Payer: 59 | Admitting: Nurse Practitioner

## 2022-10-09 ENCOUNTER — Encounter: Payer: Self-pay | Admitting: Nurse Practitioner

## 2022-10-09 ENCOUNTER — Other Ambulatory Visit: Payer: Self-pay | Admitting: Nurse Practitioner

## 2022-10-09 VITALS — BP 152/98 | HR 70 | Temp 98.3°F | Ht 62.0 in | Wt 216.6 lb

## 2022-10-09 DIAGNOSIS — Z6839 Body mass index (BMI) 39.0-39.9, adult: Secondary | ICD-10-CM

## 2022-10-09 DIAGNOSIS — I10 Essential (primary) hypertension: Secondary | ICD-10-CM | POA: Diagnosis not present

## 2022-10-09 DIAGNOSIS — Z136 Encounter for screening for cardiovascular disorders: Secondary | ICD-10-CM

## 2022-10-09 DIAGNOSIS — Z0000 Encounter for general adult medical examination without abnormal findings: Secondary | ICD-10-CM

## 2022-10-09 DIAGNOSIS — R5383 Other fatigue: Secondary | ICD-10-CM | POA: Diagnosis not present

## 2022-10-09 DIAGNOSIS — Z862 Personal history of diseases of the blood and blood-forming organs and certain disorders involving the immune mechanism: Secondary | ICD-10-CM

## 2022-10-09 DIAGNOSIS — Z1321 Encounter for screening for nutritional disorder: Secondary | ICD-10-CM

## 2022-10-09 DIAGNOSIS — Z13220 Encounter for screening for lipoid disorders: Secondary | ICD-10-CM

## 2022-10-09 DIAGNOSIS — R7309 Other abnormal glucose: Secondary | ICD-10-CM | POA: Diagnosis not present

## 2022-10-09 MED ORDER — WEGOVY 0.25 MG/0.5ML ~~LOC~~ SOAJ: 0.2500 mg | SUBCUTANEOUS | 0 refills | Status: DC

## 2022-10-09 NOTE — Progress Notes (Signed)
I,Sheena H Holbrook,acting as a Neurosurgeon for Arnette Felts, FNP.,have documented all relevant documentation on the behalf of Arnette Felts, FNP,as directed by  Arnette Felts, FNP while in the presence of Arnette Felts, FNP.   Subjective:     Patient ID: Misty [name] , female    DOB: 02-Feb-1966 , 57 y.o.   MRN: [id number]   Chief Complaint  Patient presents with   Annual Exam    HPI  Patient presents today for annual exam.  She had covid on March 18th she did take legavrio and she is doing better.   She sees Dr. Jaymes Graff for her GYN care.   Wt Readings from Last 3 Encounters: 10/09/22 : 216 lb 9.6 oz (98.2 kg) 06/09/22 : 219 lb 3.2 oz (99.4 kg) 03/19/21 : 224 lb (101.6 kg)  She has not tried any phentermine, she has tried [employer] which she thought was causing a rash but is willing to try the medication again.      Past Medical History:  Diagnosis Date   Hypertension    Morbid obesity due to excess calories    Osteoporosis      Family History  Problem Relation Age of Onset   Osteoporosis Mother    Breast cancer Sister      Current Outpatient Medications:    Semaglutide-Weight Management (WEGOVY) 0.25 MG/0.5ML SOAJ, Inject 0.25 mg into the skin once a week., Disp: 2 mL, Rfl: 0   amLODipine (NORVASC) 5 MG tablet, Take 1 tablet (5 mg total) by mouth daily., Disp: 90 tablet, Rfl: 1   cetirizine (ZYRTEC) 10 MG tablet, Take 10 mg by mouth daily., Disp: , Rfl:    Efinaconazole 10 % SOLN, Apply 1 drop topically daily. (Patient not taking: Reported on 06/09/2022), Disp: 4 mL, Rfl: 11   Magnesium Hydroxide (MAGNESIA PO), Take 1 tablet by mouth daily., Disp: , Rfl:    Multiple Vitamins-Minerals (CENTRUM SILVER 50+WOMEN PO), Take by mouth., Disp: , Rfl:    promethazine-dextromethorphan (PROMETHAZINE-DM) 6.25-15 MG/5ML syrup, Take 5 mLs by mouth 4 (four) times daily as needed for cough., Disp: 118 mL, Rfl: 0   valsartan-hydrochlorothiazide (DIOVAN-HCT) 160-25 MG tablet, TAKE  1 TABLET BY MOUTH EVERY DAY, Disp: 90 tablet, Rfl: 1   Vitamin D, Ergocalciferol, (DRISDOL) 1.25 MG (50000 UNIT) CAPS capsule, One capsule po twice weekly on Tuesdays/Fridays, Disp: 24 capsule, Rfl: 1   No Known Allergies    The patient states she is post menopausal status.  No LMP recorded. Patient is postmenopausal.   Negative for Dysmenorrhea and Negative for Menorrhagia. Negative for: breast discharge, breast lump(s), breast pain and breast self exam. Associated symptoms include abnormal vaginal bleeding. Pertinent negatives include abnormal bleeding (hematology), anxiety, decreased libido, depression, difficulty falling sleep, dyspareunia, history of infertility, nocturia, sexual dysfunction, sleep disturbances, urinary incontinence, urinary urgency, vaginal discharge and vaginal itching. Diet regular - she is cooking on Sunday for M-Th, she eats mostly chicken with vegetable with baked potato. She does not have the energy like she used to. 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 The patient states her exercise level is 2-3 times a week for 15-20 minutes each time with exercise bike and hand weights.   The patient's tobacco use is:  Social History   Tobacco Use  Smoking Status Never  Smokeless Tobacco Never   She has been exposed to passive smoke. The patient's alcohol use is:  Social History   Substance and Sexual Activity  Alcohol Use No  Additional information: Last pap 10/15/2020, next one scheduled for 10/15/2024.    Review of Systems  Constitutional: Negative.   HENT: Negative.    Eyes: Negative.   Respiratory: Negative.    Cardiovascular: Negative.   Gastrointestinal: Negative.   Endocrine: Negative.   Genitourinary: Negative.   Musculoskeletal: Negative.   Skin: Negative.   Allergic/Immunologic: Negative.   Neurological: Negative.   Hematological: Negative.   Psychiatric/Behavioral: Negative.       Today's Vitals   10/09/22 0942 10/09/22 1048  BP: (!)  130/92 (!) 152/98  Pulse: 70   Temp: 98.3 F (36.8 C)   TempSrc: Oral   SpO2: 96%   Weight: 216 lb 9.6 oz (98.2 kg)   Height: 5\' 2"  (1.575 m)    Body mass index is 39.62 kg/m.   Objective:  Physical Exam Vitals reviewed.  Constitutional:      General: She is not in acute distress.    Appearance: Normal appearance. She is well-developed. She is obese.  HENT:     Head: Normocephalic and atraumatic.     Right Ear: Hearing, tympanic membrane, ear canal and external ear normal. There is no impacted cerumen.     Left Ear: Hearing, tympanic membrane, ear canal and external ear normal. There is no impacted cerumen.     Nose: Nose normal.     Comments: Deferred - masked    Mouth/Throat:     Mouth: Mucous membranes are moist.     Comments: Deferred - masked Eyes:     General: Lids are normal.     Extraocular Movements: Extraocular movements intact.     Conjunctiva/sclera: Conjunctivae normal.     Pupils: Pupils are equal, round, and reactive to light.     Funduscopic exam:    Right eye: No papilledema.        Left eye: No papilledema.  Neck:     Thyroid: No thyroid mass.     Vascular: No carotid bruit.  Cardiovascular:     Rate and Rhythm: Normal rate and regular rhythm.     Pulses: Normal pulses.     Heart sounds: Normal heart sounds. No murmur heard. Pulmonary:     Effort: Pulmonary effort is normal. No respiratory distress.     Breath sounds: Normal breath sounds. No wheezing.  Chest:     Chest wall: No mass.  Breasts:    Tanner Score is 5.     Right: Normal. No mass or tenderness.     Left: Normal. No mass or tenderness.  Abdominal:     General: Abdomen is flat. Bowel sounds are normal. There is no distension.     Palpations: Abdomen is soft.     Tenderness: There is no abdominal tenderness.  Genitourinary:    Rectum: Guaiac result negative.  Musculoskeletal:        General: No swelling. Normal range of motion.     Cervical back: Full passive range of motion  without pain, normal range of motion and neck supple.     Right lower leg: No edema.     Left lower leg: No edema.  Lymphadenopathy:     Upper Body:     Right upper body: No supraclavicular, axillary or pectoral adenopathy.     Left upper body: No supraclavicular, axillary or pectoral adenopathy.  Skin:    General: Skin is warm and dry.     Capillary Refill: Capillary refill takes less than 2 seconds.  Neurological:     General: No focal  deficit present.     Mental Status: She is alert and oriented to person, place, and time.     Cranial Nerves: No cranial nerve deficit.     Sensory: No sensory deficit.  Psychiatric:        Mood and Affect: Mood normal.        Behavior: Behavior normal.        Thought Content: Thought content normal.        Judgment: Judgment normal.         Assessment And Plan:     1. Routine general medical examination at a health care facility Behavior modifications discussed and diet history reviewed.   Pt will continue to exercise regularly and modify diet with low GI, plant based foods and decrease intake of processed foods.  Recommend intake of daily multivitamin, Vitamin D, and calcium.  Recommend mammogram (will get records from Dr Normand Sloop) and colonoscopy (up to date) for preventive screenings, as well as recommend immunizations that include influenza, TDAP, and Shingles (declined shingrix)  2. History of anemia - CBC - Iron, TIBC and Ferritin Panel  3. Encounter for lipid screening for cardiovascular disease - Lipid panel  4. Encounter for vitamin deficiency screening - VITAMIN D 25 Hydroxy (Vit-D Deficiency, Fractures)  5. Essential hypertension Comments: Blood pressure is elevated. Encouraged to follow low-salt diet.  EKG done sinus bradycardia heart rate 58 - EKG 12-Lead - CBC - CMP14+EGFR  6. Elevated hemoglobin A1c Comments: Hemoglobin A1c is stable.  Will check levels today.  Encouraged to eat a diet low in carbs and sugars. -  Hemoglobin A1c  7. BMI 39.0-39.9,adult Comments: She would like to restart on [facility] prescription sent being approval from insurance.  She is aware how to take the medication - Semaglutide-Weight Management (WEGOVY) 0.25 MG/0.5ML SOAJ; Inject 0.25 mg into the skin once a week.  Dispense: 2 mL; Refill: 0  8. Other fatigue Comments: Will check for metabolic causes.  Encouraged to ensure she is getting good quality sleep. - Vitamin B12 - TSH - Thyroid Peroxidase Antibody - Iron, TIBC and Ferritin Panel   Patient was given opportunity to ask questions. Patient verbalized understanding of the plan and was able to repeat key elements of the plan. All questions were answered to their satisfaction.   Arnette Felts, FNP   I, Arnette Felts, FNP, have reviewed all documentation for this visit. The documentation on 10/09/22 for the exam, diagnosis, procedures, and orders are all accurate and complete  THE PATIENT IS ENCOURAGED TO PRACTICE SOCIAL DISTANCING DUE TO THE COVID-19 PANDEMIC.

## 2022-10-09 NOTE — Patient Instructions (Signed)
Encouraged to avoid eating carbohydrates in evening meal.  Increase physical activity slow progression goal 150 minutes a week

## 2022-10-10 LAB — CBC
Hematocrit: 38.9 % (ref 34.0–46.6)
Hemoglobin: 12.6 g/dL (ref 11.1–15.9)
MCH: 28.8 pg (ref 26.6–33.0)
MCHC: 32.4 g/dL (ref 31.5–35.7)
MCV: 89 fL (ref 79–97)
Platelets: 311 10*3/uL (ref 150–450)
RBC: 4.37 x10E6/uL (ref 3.77–5.28)
RDW: 12.4 % (ref 11.7–15.4)
WBC: 4.9 10*3/uL (ref 3.4–10.8)

## 2022-10-10 LAB — LIPID PANEL
Chol/HDL Ratio: 3.4 ratio (ref 0.0–4.4)
Cholesterol, Total: 202 mg/dL — ABNORMAL HIGH (ref 100–199)
HDL: 59 mg/dL (ref >39)
LDL Chol Calc (NIH): 128 mg/dL — ABNORMAL HIGH (ref 0–99)
Triglycerides: 85 mg/dL (ref 0–149)
VLDL Cholesterol Cal: 15 mg/dL (ref 5–40)

## 2022-10-10 LAB — IRON,TIBC AND FERRITIN PANEL
Ferritin: 112 ng/mL (ref 15–150)
Iron Saturation: 22 % (ref 15–55)
Iron: 53 ug/dL (ref 27–159)
Total Iron Binding Capacity: 242 ug/dL — ABNORMAL LOW (ref 250–450)
UIBC: 189 ug/dL (ref 131–425)

## 2022-10-10 LAB — HEMOGLOBIN A1C
Est. average glucose Bld gHb Est-mCnc: 131 mg/dL
Hgb A1c MFr Bld: 6.2 % — ABNORMAL HIGH (ref 4.8–5.6)

## 2022-10-10 LAB — CMP14+EGFR
ALT: 16 IU/L (ref 0–32)
AST: 17 IU/L (ref 0–40)
Albumin/Globulin Ratio: 1.6 (ref 1.2–2.2)
Albumin: 4.4 g/dL (ref 3.8–4.9)
Alkaline Phosphatase: 94 IU/L (ref 44–121)
BUN/Creatinine Ratio: 11 (ref 9–23)
BUN: 9 mg/dL (ref 6–24)
Bilirubin Total: 0.5 mg/dL (ref 0.0–1.2)
CO2: 25 mmol/L (ref 20–29)
Calcium: 9.7 mg/dL (ref 8.7–10.2)
Chloride: 99 mmol/L (ref 96–106)
Creatinine, Ser: 0.83 mg/dL (ref 0.57–1.00)
Globulin, Total: 2.8 g/dL (ref 1.5–4.5)
Glucose: 98 mg/dL (ref 70–99)
Potassium: 4.4 mmol/L (ref 3.5–5.2)
Sodium: 138 mmol/L (ref 134–144)
Total Protein: 7.2 g/dL (ref 6.0–8.5)
eGFR: 82 mL/min/{1.73_m2} (ref >59)

## 2022-10-10 LAB — VITAMIN D 25 HYDROXY (VIT D DEFICIENCY, FRACTURES): Vit D, 25-Hydroxy: 33.1 ng/mL (ref 30.0–100.0)

## 2022-10-10 LAB — THYROID PEROXIDASE ANTIBODY: Thyroperoxidase Ab SerPl-aCnc: <9 IU/mL (ref 0–34)

## 2022-10-10 LAB — VITAMIN B12: Vitamin B-12: 437 pg/mL (ref 232–1245)

## 2022-10-10 LAB — TSH: TSH: 2.05 u[IU]/mL (ref 0.450–4.500)

## 2022-10-13 NOTE — Telephone Encounter (Signed)
Pharmacy comment: Alternative Requested:HELLO THIS IS NOT COVERED PLEASE SEND ALTERNATIVE OR CONTACT THE INSURANCE FOR PRIOR AUTH.  

## 2022-10-17 ENCOUNTER — Encounter: Payer: Self-pay | Admitting: Nurse Practitioner

## 2022-10-20 ENCOUNTER — Other Ambulatory Visit: Payer: Self-pay | Admitting: Nurse Practitioner

## 2022-10-20 DIAGNOSIS — Z6839 Body mass index (BMI) 39.0-39.9, adult: Secondary | ICD-10-CM

## 2022-10-20 DIAGNOSIS — R5383 Other fatigue: Secondary | ICD-10-CM

## 2022-12-22 ENCOUNTER — Other Ambulatory Visit: Payer: Self-pay | Admitting: Nurse Practitioner

## 2022-12-22 DIAGNOSIS — I10 Essential (primary) hypertension: Secondary | ICD-10-CM

## 2023-01-22 ENCOUNTER — Other Ambulatory Visit: Payer: Self-pay | Admitting: Nurse Practitioner

## 2023-01-22 DIAGNOSIS — I10 Essential (primary) hypertension: Secondary | ICD-10-CM

## 2023-02-05 LAB — HM MAMMOGRAPHY

## 2023-02-09 ENCOUNTER — Ambulatory Visit: Payer: 59 | Admitting: Internal Medicine

## 2023-02-09 NOTE — Progress Notes (Deleted)
I, T Deloria Lair, CMA,acting as a Neurosurgeon for Gwynneth Aliment, MD.,have documented all relevant documentation on the behalf of Gwynneth Aliment, MD,as directed by  Gwynneth Aliment, MD while in the presence of Gwynneth Aliment, MD.  Subjective:  Patient ID: Misty [name] , female    DOB: 1966-03-29 , 57 y.o.   MRN: [id number]  No chief complaint on file.   HPI  Patient presents today for a BP check. She reports compliance with medications. Denies headache, chest pain, SOB.       Past Medical History:  Diagnosis Date  . Hypertension   . Morbid obesity due to excess calories (HCC)   . Osteoporosis      Family History  Problem Relation Age of Onset  . Osteoporosis Mother   . Breast cancer Sister      Current Outpatient Medications:  .  amLODipine (NORVASC) 5 MG tablet, TAKE 1 TABLET (5 MG TOTAL) BY MOUTH DAILY., Disp: 30 tablet, Rfl: 5 .  cetirizine (ZYRTEC) 10 MG tablet, Take 10 mg by mouth daily., Disp: , Rfl:  .  Efinaconazole 10 % SOLN, Apply 1 drop topically daily. (Patient not taking: Reported on 06/09/2022), Disp: 4 mL, Rfl: 11 .  Magnesium Hydroxide (MAGNESIA PO), Take 1 tablet by mouth daily., Disp: , Rfl:  .  Multiple Vitamins-Minerals (CENTRUM SILVER 50+WOMEN PO), Take by mouth., Disp: , Rfl:  .  promethazine-dextromethorphan (PROMETHAZINE-DM) 6.25-15 MG/5ML syrup, Take 5 mLs by mouth 4 (four) times daily as needed for cough., Disp: 118 mL, Rfl: 0 .  Semaglutide-Weight Management (WEGOVY) 0.25 MG/0.5ML SOAJ, Inject 0.25 mg into the skin once a week., Disp: 2 mL, Rfl: 0 .  valsartan-hydrochlorothiazide (DIOVAN-HCT) 160-25 MG tablet, TAKE 1 TABLET BY MOUTH EVERY DAY, Disp: 30 tablet, Rfl: 5 .  Vitamin D, Ergocalciferol, (DRISDOL) 1.25 MG (50000 UNIT) CAPS capsule, One capsule po twice weekly on Tuesdays/Fridays, Disp: 24 capsule, Rfl: 1   No Known Allergies   Review of Systems  Constitutional: Negative.   Respiratory: Negative.    Cardiovascular: Negative.    Neurological: Negative.   Psychiatric/Behavioral: Negative.      There were no vitals filed for this visit. There is no height or weight on file to calculate BMI.  Wt Readings from Last 3 Encounters:  10/09/22 216 lb 9.6 oz (98.2 kg)  06/09/22 219 lb 3.2 oz (99.4 kg)  03/19/21 224 lb (101.6 kg)     Objective:  Physical Exam      Assessment And Plan:  There are no diagnoses linked to this encounter.   No follow-ups on file.  Patient was given opportunity to ask questions. Patient verbalized understanding of the plan and was able to repeat key elements of the plan. All questions were answered to their satisfaction.  Gwynneth Aliment, MD  I, Gwynneth Aliment, MD, have reviewed all documentation for this visit. The documentation on 02/09/23 for the exam, diagnosis, procedures, and orders are all accurate and complete.   IF YOU HAVE BEEN REFERRED TO A SPECIALIST, IT MAY TAKE 1-2 WEEKS TO SCHEDULE/PROCESS THE REFERRAL. IF YOU HAVE NOT HEARD FROM US/SPECIALIST IN TWO WEEKS, PLEASE GIVE [place] A CALL AT [phone].   THE PATIENT IS ENCOURAGED TO PRACTICE SOCIAL DISTANCING DUE TO THE COVID-19 PANDEMIC.

## 2023-02-20 ENCOUNTER — Telehealth: Payer: Self-pay | Admitting: Podiatry

## 2023-02-20 ENCOUNTER — Ambulatory Visit: Payer: 59 | Admitting: Podiatry

## 2023-02-20 ENCOUNTER — Ambulatory Visit (INDEPENDENT_AMBULATORY_CARE_PROVIDER_SITE_OTHER): Payer: 59

## 2023-02-20 DIAGNOSIS — M899 Disorder of bone, unspecified: Secondary | ICD-10-CM

## 2023-02-20 DIAGNOSIS — B351 Tinea unguium: Secondary | ICD-10-CM | POA: Diagnosis not present

## 2023-02-20 NOTE — Telephone Encounter (Signed)
Notification or Prior Authorization is not required for the requested services This [employer] plan does not currently require a prior authorization for these services. If you have general questions about the prior authorization requirements, please call us at [phone] or visit [email or web address] > Clinician Resources > Advance and Admission Notification Requirements. The number above acknowledges your notification. Please write this number down for future reference. Notification is not a guarantee of coverage or payment. Decision ID #: [id number] The number above acknowledges your inquiry and our response. Please write this number down and refer to it for future inquiries. Coverage and payment for an item or service is governed by the member's benefit plan document, and, if applicable, the provider's participation agreement with the Health Plan. Patient details  Patient name Misty [name] Member number [id number] Group number [id number] Product POS Relationship Employee Effective date 07/07/2021 Termination date 07/06/9998 Insurance type [employer] Verbal language preference - Written language preference - A future timeline may be available for this member. For future coverage please call the telephone number located on the back of the [facility] ID card. Admitting/attending physician details  Name Ovid Curd Tax ID number [id number] Address [address] [place], [place], [place] [address] Status In network Service details  Place [facility] What is place of service? Service details Surgical Facility details  Name [facility] Tax ID number [id number] Address [address] [place], [place] [address] [phone] Status In network Facility service dates details  Start date 03/11/2023 End date 06/09/2023 Service description Scheduled What is service description? Diagnosis code details  Code  pointer Primary Diagnosis code M89.9 Description Disorder of bone, unspecified Procedure code details  Code pointer Primary Procedure Code 29518 Description Excision or curettage of bone cyst or benign tumor, phalanges of foot Selected servicing provider The provider who is providing the service being requested.  Servicing provider name Nadara Mustard Tax ID number [id number] Address [address] [place], [place], [place] [address]  T. [phone] Status In network Code pointer New Procedure Code 11730 Description Avulsion of nail plate, partial or complete, simple; single Selected servicing provider The provider who is providing the service being requested.  Servicing provider name Nadara Mustard Tax ID number [id number] Address [address] [place], [place], [place] [address]  T. [phone] Status In network

## 2023-02-20 NOTE — Patient Instructions (Signed)

## 2023-02-20 NOTE — Progress Notes (Unsigned)
Sx right nail removal exosiss

## 2023-03-10 ENCOUNTER — Telehealth: Payer: Self-pay | Admitting: Podiatry

## 2023-03-10 NOTE — Telephone Encounter (Signed)
Patient called in to change surgery date to 05/06/23.

## 2023-03-16 ENCOUNTER — Encounter: Payer: 59 | Admitting: Podiatry

## 2023-03-26 ENCOUNTER — Encounter: Payer: 59 | Admitting: Podiatry

## 2023-04-09 ENCOUNTER — Encounter: Payer: 59 | Admitting: Podiatry

## 2023-04-23 DIAGNOSIS — M79673 Pain in unspecified foot: Secondary | ICD-10-CM

## 2023-05-06 ENCOUNTER — Other Ambulatory Visit: Payer: Self-pay | Admitting: Podiatry

## 2023-05-06 DIAGNOSIS — M25774 Osteophyte, right foot: Secondary | ICD-10-CM | POA: Diagnosis not present

## 2023-05-06 DIAGNOSIS — B351 Tinea unguium: Secondary | ICD-10-CM | POA: Diagnosis not present

## 2023-05-06 DIAGNOSIS — L603 Nail dystrophy: Secondary | ICD-10-CM

## 2023-05-06 MED ORDER — HYDROCODONE-ACETAMINOPHEN 5-325 MG PO TABS: 1.0000 | ORAL_TABLET | Freq: Four times a day (QID) | ORAL | 0 refills | Status: DC | PRN

## 2023-05-06 MED ORDER — PROMETHAZINE HCL 25 MG PO TABS: 25.0000 mg | ORAL_TABLET | Freq: Three times a day (TID) | ORAL | 0 refills | Status: DC | PRN

## 2023-05-06 MED ORDER — CEPHALEXIN 500 MG PO CAPS: 500.0000 mg | ORAL_CAPSULE | Freq: Three times a day (TID) | ORAL | 0 refills | Status: DC

## 2023-05-07 ENCOUNTER — Telehealth: Payer: Self-pay | Admitting: Podiatry

## 2023-05-07 NOTE — Telephone Encounter (Signed)
Called patient to see how she was doing post-op. She states she is doing better. She didn't take pain medication and it was throbbing but she took the pain medication and it helped. No fevers, chills, chest pain, SOB. She has no other concerns. Encouraged to call with any changes, otherwise I will see her next week.

## 2023-05-11 ENCOUNTER — Encounter: Payer: Self-pay | Admitting: Podiatry

## 2023-05-11 ENCOUNTER — Ambulatory Visit (INDEPENDENT_AMBULATORY_CARE_PROVIDER_SITE_OTHER): Payer: 59 | Admitting: Podiatry

## 2023-05-11 ENCOUNTER — Ambulatory Visit (INDEPENDENT_AMBULATORY_CARE_PROVIDER_SITE_OTHER): Payer: 59

## 2023-05-11 DIAGNOSIS — M898X9 Other specified disorders of bone, unspecified site: Secondary | ICD-10-CM

## 2023-05-11 DIAGNOSIS — M25774 Osteophyte, right foot: Secondary | ICD-10-CM | POA: Diagnosis not present

## 2023-05-11 DIAGNOSIS — Z9889 Other specified postprocedural states: Secondary | ICD-10-CM

## 2023-05-13 NOTE — Progress Notes (Signed)
Subjective: Chief Complaint  Patient presents with   Routine Post Op    Right big toe post surgery patient states is good just much more pain this time around.   57 year old female presents the office today for postop visit #1 status post exostectomy, total nail removal of the the right hallux.  Pain is currently controlled.  Denies any fevers or chills.  She has no new concerns otherwise.  Objective: AAO x3, NAD DP/PT pulses palpable bilaterally, CRT less than 3 seconds Incision well coapted in the right hallux with sutures intact.  Nailbed intact and the wound is granulating in.  There is no exposed bone today.  There is mild edema.  There is no surrounding erythema, ascending cellulitis.  There is no fluctuation or crepitation.  There is no sign of infection noted otherwise. No pain with calf compression, swelling, warmth, erythema  Assessment:  Status post exostectomy, total nail removal Plan: -All treatment options discussed with the patient including all alternatives, risks, complications.  -X-rays obtained reviewed.  No evidence of acute fracture. -Incisions healing well.  Xeroform applied followed by dressing.  Discussed that she can change the dressing every couple days with a similar bandage as well for antibiotic ointment.  Continue surgical shoe, ice, elevation.  Pain medication as needed. -Monitor for any clinical signs or symptoms of infection and directed to call the office immediately should any occur or go to the ER. -Patient encouraged to call the office with any questions, concerns, change in symptoms.   Vivi Barrack DPM

## 2023-05-14 ENCOUNTER — Encounter: Payer: Self-pay | Admitting: Podiatry

## 2023-05-14 ENCOUNTER — Other Ambulatory Visit: Payer: Self-pay | Admitting: Podiatry

## 2023-05-21 ENCOUNTER — Ambulatory Visit (INDEPENDENT_AMBULATORY_CARE_PROVIDER_SITE_OTHER): Payer: 59 | Admitting: Podiatry

## 2023-05-21 ENCOUNTER — Encounter: Payer: Self-pay | Admitting: Podiatry

## 2023-05-21 ENCOUNTER — Encounter: Payer: 59 | Admitting: Podiatry

## 2023-05-21 DIAGNOSIS — M898X9 Other specified disorders of bone, unspecified site: Secondary | ICD-10-CM

## 2023-05-21 NOTE — Patient Instructions (Signed)
Soak Instructions- start with once a day    THE DAY AFTER THE PROCEDURE  Place 1/4 cup of epsom salts in a quart of warm tap water.  Submerge your foot or feet with outer bandage intact for the initial soak; this will allow the bandage to become moist and wet for easy lift off.  Once you remove your bandage, continue to soak in the solution for 20 minutes.  This soak should be done twice a day.  Next, remove your foot or feet from solution, blot dry the affected area and cover.  You may use a band aid large enough to cover the area or use gauze and tape.  Apply other medications to the area as directed by the doctor such as polysporin neosporin.  IF YOUR SKIN BECOMES IRRITATED WHILE USING THESE INSTRUCTIONS, IT IS OKAY TO SWITCH TO  WHITE VINEGAR AND WATER. Or you may use antibacterial soap and water to keep the toe clean  Monitor for any signs/symptoms of infection. Call the office immediately if any occur or go directly to the emergency room. Call with any questions/concerns.

## 2023-05-21 NOTE — Progress Notes (Signed)
Subjective: No chief complaint on file.   57 year old female presents the office today for postop visit #2 status post exostectomy, total nail removal of the the right hallux.  She presents today for possible suture removal.  Pain is controlled.  She is still wearing surgical shoe.  No fevers or chills.  Objective: AAO x3, NAD DP/PT pulses palpable bilaterally, CRT less than 3 seconds Incision well coapted in the right hallux with sutures intact.  Nailbed intact and the wound is granulating in.  There is no exposed bone or tendon today.  There is no skin erythema, ascending size.  There is no fluctuation or crepitation.  No malodor.  No pain with calf compression, swelling, warmth, erythema  Assessment: Status post exostectomy, total nail removal  Plan: -All treatment options discussed with the patient including all alternatives, risks, complications.  -Incisions healing well.  Sutures removed complications.  Nonadherent dressing was applied followed by regular dressing.  Discussed that she can start soaking Epsom salts daily in next couple of days and apply a small amount of antibiotic ointment followed by dressing.  Remain in surgical shoe for now.  We discussed gradual transition to regular shoe as tolerated. -Reviewed pathology -Monitor for any clinical signs or symptoms of infection and directed to call the office immediately should any occur or go to the ER.  Return in about 3 weeks (around 06/11/2023).  Vivi Barrack DPM

## 2023-06-01 ENCOUNTER — Ambulatory Visit: Payer: 59 | Admitting: Nurse Practitioner

## 2023-06-01 ENCOUNTER — Encounter: Payer: Self-pay | Admitting: Nurse Practitioner

## 2023-06-01 VITALS — BP 120/80 | HR 83 | Temp 97.8°F | Ht 62.6 in | Wt 215.0 lb

## 2023-06-01 DIAGNOSIS — R7309 Other abnormal glucose: Secondary | ICD-10-CM | POA: Diagnosis not present

## 2023-06-01 DIAGNOSIS — Z2821 Immunization not carried out because of patient refusal: Secondary | ICD-10-CM | POA: Diagnosis not present

## 2023-06-01 DIAGNOSIS — I10 Essential (primary) hypertension: Secondary | ICD-10-CM | POA: Diagnosis not present

## 2023-06-01 DIAGNOSIS — E7800 Pure hypercholesterolemia, unspecified: Secondary | ICD-10-CM

## 2023-06-01 NOTE — Progress Notes (Unsigned)
Madelaine Bhat, CMA,acting as a Neurosurgeon for Arnette Felts, FNP.,have documented all relevant documentation on the behalf of Arnette Felts, FNP,as directed by  Arnette Felts, FNP while in the presence of Arnette Felts, FNP.  Subjective:  Patient ID: Misty [name] , female    DOB: 1966-07-03 , 57 y.o.   MRN: [id number]  Chief Complaint  Patient presents with   Hypertension    HPI  Patient presents today for a bp follow up, Patient reports compliance with medication. Patient denies any chest pain, SOB, or headaches. Patient has no concerns today. Today is the first day in a full shoe after having her toe operated on with nailbed and bone spur removed. No changes in her diet. She is to f/u with podiatry next week. She feels this was more difficult getting better.      Past Medical History:  Diagnosis Date   Hypertension    Morbid obesity due to excess calories (HCC)    Osteoporosis      Family History  Problem Relation Age of Onset   Osteoporosis Mother    Breast cancer Sister      Current Outpatient Medications:    amLODipine (NORVASC) 5 MG tablet, TAKE 1 TABLET (5 MG TOTAL) BY MOUTH DAILY., Disp: 30 tablet, Rfl: 5   cetirizine (ZYRTEC) 10 MG tablet, Take 10 mg by mouth daily., Disp: , Rfl:    Magnesium Hydroxide (MAGNESIA PO), Take 1 tablet by mouth daily., Disp: , Rfl:    Multiple Vitamins-Minerals (CENTRUM SILVER 50+WOMEN PO), Take by mouth., Disp: , Rfl:    valsartan-hydrochlorothiazide (DIOVAN-HCT) 160-25 MG tablet, TAKE 1 TABLET BY MOUTH EVERY DAY, Disp: 30 tablet, Rfl: 5   Vitamin D, Ergocalciferol, (DRISDOL) 1.25 MG (50000 UNIT) CAPS capsule, One capsule po twice weekly on Tuesdays/Fridays, Disp: 24 capsule, Rfl: 1   No Known Allergies   Review of Systems  Constitutional: Negative.   Eyes: Negative.   Respiratory: Negative.    Cardiovascular: Negative.   Musculoskeletal: Negative.   Skin: Negative.   Psychiatric/Behavioral: Negative.       Today's Vitals    06/01/23 1612 06/01/23 1651  BP: (!) 140/80 120/80  Pulse: 83   Temp: 97.8 F (36.6 C)   Weight: 215 lb (97.5 kg)   Height: 5' 2.6" (1.59 m)   PainSc: 1    PainLoc: Toe    Body mass index is 38.57 kg/m.  Wt Readings from Last 3 Encounters:  06/01/23 215 lb (97.5 kg)  10/09/22 216 lb 9.6 oz (98.2 kg)  06/09/22 219 lb 3.2 oz (99.4 kg)     Objective:  Physical Exam Vitals reviewed.  Constitutional:      General: She is not in acute distress.    Appearance: Normal appearance. She is well-developed. She is obese.  HENT:     Head: Normocephalic and atraumatic.  Eyes:     Pupils: Pupils are equal, round, and reactive to light.  Cardiovascular:     Rate and Rhythm: Normal rate and regular rhythm.     Pulses: Normal pulses.     Heart sounds: Normal heart sounds. No murmur heard. Pulmonary:     Effort: Pulmonary effort is normal. No respiratory distress.     Breath sounds: Normal breath sounds. No wheezing.  Skin:    General: Skin is warm and dry.     Capillary Refill: Capillary refill takes less than 2 seconds.  Neurological:     General: No focal deficit present.     Mental  Status: She is alert and oriented to person, place, and time.     Cranial Nerves: No cranial nerve deficit.  Psychiatric:        Mood and Affect: Mood normal.        Behavior: Behavior normal.        Thought Content: Thought content normal.        Judgment: Judgment normal.         Assessment And Plan:  Essential hypertension Assessment & Plan: Blood pressure is well controlled, continue current medications.   Orders: -     BMP8+eGFR  Elevated hemoglobin A1c Assessment & Plan: HgbA1c is elevated, no current medications. Continue focusing on healthy diet and regular exercise  Orders: -     Hemoglobin A1c  Elevated cholesterol Assessment & Plan: Cholesterol levels are increased at last visit, diet controlled. Continue to focus on low fat diet  Orders: -     Lipid panel  COVID-19  vaccination declined Assessment & Plan: Declines covid 19 vaccine. Discussed risk of covid 57 and if she changes her mind about the vaccine to call the office. Education has been provided regarding the importance of this vaccine but patient still declined. Advised may receive this vaccine at local pharmacy or Health Dept.or vaccine clinic. Aware to provide a copy of the vaccination record if obtained from local pharmacy or Health Dept.  Encouraged to take multivitamin, vitamin d, vitamin c and zinc to increase immune system. Aware can call office if would like to have vaccine here at office. Verbalized acceptance and understanding.     Herpes zoster vaccination declined Assessment & Plan: Declines shingrix, educated on disease process and is aware if he changes his mind to notify office    Influenza vaccination declined Assessment & Plan: Patient declined influenza vaccination at this time. Patient is aware that influenza vaccine prevents illness in 70% of healthy people, and reduces hospitalizations to 30-70% in elderly. This vaccine is recommended annually. Education has been provided regarding the importance of this vaccine but patient still declined. Advised may receive this vaccine at local pharmacy or Health Dept.or vaccine clinic. Aware to provide a copy of the vaccination record if obtained from local pharmacy or Health Dept.  Pt is willing to accept risk associated with refusing vaccination.      Return for HM in April 2025.  Patient was given opportunity to ask questions. Patient verbalized understanding of the plan and was able to repeat key elements of the plan. All questions were answered to their satisfaction.   Jeanell Sparrow, FNP, have reviewed all documentation for this visit. The documentation on 06/01/23 for the exam, diagnosis, procedures, and orders are all accurate and complete.   IF YOU HAVE BEEN REFERRED TO A SPECIALIST, IT MAY TAKE 1-2 WEEKS TO SCHEDULE/PROCESS THE  REFERRAL. IF YOU HAVE NOT HEARD FROM US/SPECIALIST IN TWO WEEKS, PLEASE GIVE [place] A CALL AT [phone].

## 2023-06-02 ENCOUNTER — Encounter: Payer: 59 | Admitting: Podiatry

## 2023-06-02 LAB — BMP8+EGFR
BUN/Creatinine Ratio: 12 (ref 9–23)
BUN: 11 mg/dL (ref 6–24)
CO2: 25 mmol/L (ref 20–29)
Calcium: 9.7 mg/dL (ref 8.7–10.2)
Chloride: 100 mmol/L (ref 96–106)
Creatinine, Ser: 0.89 mg/dL (ref 0.57–1.00)
Glucose: 87 mg/dL (ref 70–99)
Potassium: 3.7 mmol/L (ref 3.5–5.2)
Sodium: 139 mmol/L (ref 134–144)
eGFR: 75 mL/min/{1.73_m2} (ref >59)

## 2023-06-02 LAB — LIPID PANEL
Chol/HDL Ratio: 3.6 {ratio} (ref 0.0–4.4)
Cholesterol, Total: 196 mg/dL (ref 100–199)
HDL: 54 mg/dL (ref >39)
LDL Chol Calc (NIH): 126 mg/dL — ABNORMAL HIGH (ref 0–99)
Triglycerides: 90 mg/dL (ref 0–149)
VLDL Cholesterol Cal: 16 mg/dL (ref 5–40)

## 2023-06-02 LAB — HEMOGLOBIN A1C
Est. average glucose Bld gHb Est-mCnc: 143 mg/dL
Hgb A1c MFr Bld: 6.6 % — ABNORMAL HIGH (ref 4.8–5.6)

## 2023-06-03 ENCOUNTER — Encounter: Payer: Self-pay | Admitting: Nurse Practitioner

## 2023-06-03 DIAGNOSIS — Z2821 Immunization not carried out because of patient refusal: Secondary | ICD-10-CM | POA: Insufficient documentation

## 2023-06-03 DIAGNOSIS — R7309 Other abnormal glucose: Secondary | ICD-10-CM | POA: Insufficient documentation

## 2023-06-03 DIAGNOSIS — E7800 Pure hypercholesterolemia, unspecified: Secondary | ICD-10-CM | POA: Insufficient documentation

## 2023-06-03 NOTE — Assessment & Plan Note (Signed)

## 2023-06-03 NOTE — Assessment & Plan Note (Signed)

## 2023-06-03 NOTE — Assessment & Plan Note (Signed)
Blood pressure is well controlled, continue current medications.

## 2023-06-03 NOTE — Assessment & Plan Note (Signed)
Cholesterol levels are increased at last visit, diet controlled. Continue to focus on low fat diet

## 2023-06-03 NOTE — Assessment & Plan Note (Signed)
Declines shingrix, educated on disease process and is aware if he changes his mind to notify office  

## 2023-06-03 NOTE — Assessment & Plan Note (Signed)
HgbA1c is elevated, no current medications. Continue focusing on healthy diet and regular exercise

## 2023-06-08 ENCOUNTER — Other Ambulatory Visit: Payer: Self-pay | Admitting: Nurse Practitioner

## 2023-06-08 MED ORDER — WEGOVY 0.5 MG/0.5ML ~~LOC~~ SOAJ: 0.5000 mg | SUBCUTANEOUS | 1 refills | Status: DC

## 2023-06-10 ENCOUNTER — Encounter: Payer: Self-pay | Admitting: Podiatry

## 2023-06-11 ENCOUNTER — Encounter: Payer: Self-pay | Admitting: Podiatry

## 2023-06-11 ENCOUNTER — Ambulatory Visit (INDEPENDENT_AMBULATORY_CARE_PROVIDER_SITE_OTHER): Payer: 59 | Admitting: Podiatry

## 2023-06-11 DIAGNOSIS — L603 Nail dystrophy: Secondary | ICD-10-CM

## 2023-06-11 DIAGNOSIS — M898X9 Other specified disorders of bone, unspecified site: Secondary | ICD-10-CM

## 2023-06-14 NOTE — Progress Notes (Signed)
Subjective: Chief Complaint  Patient presents with   Routine Post Op    Rm#13 POV right foot big toe patient states doing well.     57 year old female presents the office today for postop visit #3 status post exostectomy, total nail removal of the the right hallux.  She is been doing well.  She is back to when her work boots and back at work.  It is scabbed over and she is not in any pain.  No drainage or pus.  No fevers or chills.  No other concerns.  Objective: AAO x3, NAD DP/PT pulses palpable bilaterally, CRT less than 3 seconds Incisions well-healed.  Scab is present along the nailbed without any open lesions identified at this time.  There is no drainage or pus or any signs of infection.  There is no pain on exam. No pain with calf compression, swelling, warmth, erythema  Assessment: Status post exostectomy, total nail removal  Plan: -All treatment options discussed with the patient including all alternatives, risks, complications.  -Overall doing much better.  Still wash with soap and water, dry thoroughly and keep the antibiotic ointment elevated on from the day when she is working.  I dispensed a toe cap to get help offload.  Discussed shoe modifications avoid any excess pressure. -I will plan to see her back on an as-needed basis and she agrees with this plan.  If she has any questions or concerns let me know. -Monitor for any clinical signs or symptoms of infection and directed to call the office immediately should any occur or go to the ER.  Return if symptoms worsen or fail to improve.  Vivi Barrack DPM

## 2023-06-29 ENCOUNTER — Other Ambulatory Visit: Payer: Self-pay | Admitting: Nurse Practitioner

## 2023-06-29 DIAGNOSIS — I10 Essential (primary) hypertension: Secondary | ICD-10-CM

## 2023-07-31 ENCOUNTER — Other Ambulatory Visit: Payer: Self-pay | Admitting: Nurse Practitioner

## 2023-07-31 DIAGNOSIS — I10 Essential (primary) hypertension: Secondary | ICD-10-CM

## 2023-10-13 ENCOUNTER — Ambulatory Visit (INDEPENDENT_AMBULATORY_CARE_PROVIDER_SITE_OTHER): Payer: 59 | Admitting: Nurse Practitioner

## 2023-10-13 ENCOUNTER — Encounter: Payer: Self-pay | Admitting: Nurse Practitioner

## 2023-10-13 VITALS — BP 130/80 | HR 63 | Temp 98.7°F | Ht 62.0 in | Wt 217.8 lb

## 2023-10-13 DIAGNOSIS — E1169 Type 2 diabetes mellitus with other specified complication: Secondary | ICD-10-CM | POA: Diagnosis not present

## 2023-10-13 DIAGNOSIS — E7800 Pure hypercholesterolemia, unspecified: Secondary | ICD-10-CM | POA: Diagnosis not present

## 2023-10-13 DIAGNOSIS — Z0000 Encounter for general adult medical examination without abnormal findings: Secondary | ICD-10-CM

## 2023-10-13 DIAGNOSIS — I10 Essential (primary) hypertension: Secondary | ICD-10-CM

## 2023-10-13 DIAGNOSIS — Z2821 Immunization not carried out because of patient refusal: Secondary | ICD-10-CM

## 2023-10-13 DIAGNOSIS — Z79899 Other long term (current) drug therapy: Secondary | ICD-10-CM

## 2023-10-13 DIAGNOSIS — E669 Obesity, unspecified: Secondary | ICD-10-CM

## 2023-10-13 DIAGNOSIS — R82998 Other abnormal findings in urine: Secondary | ICD-10-CM

## 2023-10-13 DIAGNOSIS — Z6839 Body mass index (BMI) 39.0-39.9, adult: Secondary | ICD-10-CM

## 2023-10-13 LAB — POCT URINALYSIS DIP (CLINITEK)
Bilirubin, UA: NEGATIVE
Blood, UA: NEGATIVE
Glucose, UA: NEGATIVE mg/dL
Ketones, POC UA: NEGATIVE mg/dL
Nitrite, UA: NEGATIVE
POC PROTEIN,UA: NEGATIVE
Spec Grav, UA: 1.025 (ref 1.010–1.025)
Urobilinogen, UA: 0.2 U/dL
pH, UA: 6.5 (ref 5.0–8.0)

## 2023-10-13 MED ORDER — OZEMPIC (0.25 OR 0.5 MG/DOSE) 2 MG/1.5ML ~~LOC~~ SOPN: 0.5000 mg | PEN_INJECTOR | SUBCUTANEOUS | 1 refills | Status: DC

## 2023-10-13 NOTE — Assessment & Plan Note (Signed)

## 2023-10-13 NOTE — Progress Notes (Signed)
 [place], CMA,acting as a Neurosurgeon for Misty Epley, FNP.,have documented all relevant documentation on the behalf of Misty Epley, FNP,as directed by  Misty Epley, FNP while in the presence of Misty Epley, FNP.  Subjective:    Patient ID: Misty [name] , female    DOB: 01-29-1966 , 58 y.o.   MRN: [id number]  Chief Complaint  Patient presents with   Annual Exam    HPI  Patient presents today for HM, Patient reports compliance with medication. Patient denies any chest pain, SOB, or headaches. Patient has no concerns today. She had right toe surgery to remove a bone spur and removed her toenail Oct 2024. She had her PAP and Mammogram done in August 2024.      Past Medical History:  Diagnosis Date   Hypertension    Morbid obesity due to excess calories (HCC)    Osteoporosis      Family History  Problem Relation Age of Onset   Osteoporosis Mother    Breast cancer Sister      Current Outpatient Medications:    amLODipine (NORVASC) 5 MG tablet, TAKE 1 TABLET (5 MG TOTAL) BY MOUTH DAILY., Disp: 30 tablet, Rfl: 5   cetirizine (ZYRTEC) 10 MG tablet, Take 10 mg by mouth daily., Disp: , Rfl:    Magnesium Hydroxide (MAGNESIA PO), Take 1 tablet by mouth daily., Disp: , Rfl:    Multiple Vitamins-Minerals (CENTRUM SILVER 50+WOMEN PO), Take by mouth., Disp: , Rfl:    Semaglutide,0.25 or 0.5MG /DOS, (OZEMPIC, 0.25 OR 0.5 MG/DOSE,) 2 MG/1.5ML SOPN, Inject 0.5 mg into the skin once a week., Disp: 4.5 mL, Rfl: 1   valsartan-hydrochlorothiazide (DIOVAN-HCT) 160-25 MG tablet, TAKE 1 TABLET BY MOUTH EVERY DAY, Disp: 30 tablet, Rfl: 5   Vitamin D, Ergocalciferol, (DRISDOL) 1.25 MG (50000 UNIT) CAPS capsule, One capsule po twice weekly on Tuesdays/Fridays, Disp: 24 capsule, Rfl: 1   No Known Allergies    The patient states she uses post menopausal status for birth control. No LMP recorded. Patient is postmenopausal.  Negative for: breast discharge, breast lump(s), breast pain and breast  self exam. Associated symptoms include abnormal vaginal bleeding. Pertinent negatives include abnormal bleeding (hematology), anxiety, decreased libido, depression, difficulty falling sleep, dyspareunia, history of infertility, nocturia, sexual dysfunction, sleep disturbances, urinary incontinence, urinary urgency, vaginal discharge and vaginal itching. Diet regular; she has made some changes and stopped drinking regular sodas and cut back on fried foods. She has been trying to cook more on the weekends.  The patient states her exercise level is minimal with a stationary bike and small weights 2 days a week. She is retiring July 2025 from the [employer].   The patient's tobacco use is:  Social History   Tobacco Use  Smoking Status Never  Smokeless Tobacco Never  . She has been exposed to passive smoke. The patient's alcohol use is:  Social History   Substance and Sexual Activity  Alcohol Use No  . Additional information: Last pap will get records from her GYN  Review of Systems  Constitutional: Negative.   Eyes: Negative.   Respiratory: Negative.    Cardiovascular: Negative.   Gastrointestinal: Negative.   Endocrine: Negative.   Genitourinary: Negative.   Musculoskeletal: Negative.   Skin: Negative.   Allergic/Immunologic: Negative.   Neurological: Negative.   Hematological: Negative.   Psychiatric/Behavioral: Negative.       Today's Vitals   10/13/23 0850  BP: 130/80  Pulse: 63  Temp: 98.7 F (37.1 C)  TempSrc:  Oral  Weight: 217 lb 12.8 oz (98.8 kg)  Height: 5\' 2"  (1.575 m)  PainSc: 0-No pain   Body mass index is 39.84 kg/m.  Wt Readings from Last 3 Encounters:  10/13/23 217 lb 12.8 oz (98.8 kg)  06/01/23 215 lb (97.5 kg)  10/09/22 216 lb 9.6 oz (98.2 kg)     Objective:  Physical Exam Vitals and nursing note reviewed.  Constitutional:      General: She is not in acute distress.    Appearance: Normal appearance. She is well-developed. She is obese.   HENT:     Head: Normocephalic and atraumatic.     Right Ear: Hearing, tympanic membrane, ear canal and external ear normal. There is no impacted cerumen.     Left Ear: Hearing, tympanic membrane, ear canal and external ear normal. There is no impacted cerumen.     Nose: Nose normal.     Mouth/Throat:     Mouth: Mucous membranes are moist.  Eyes:     General: Lids are normal.     Extraocular Movements: Extraocular movements intact.     Conjunctiva/sclera: Conjunctivae normal.     Pupils: Pupils are equal, round, and reactive to light.     Funduscopic exam:    Right eye: No papilledema.        Left eye: No papilledema.  Neck:     Thyroid: No thyroid mass.     Vascular: No carotid bruit.  Cardiovascular:     Rate and Rhythm: Normal rate and regular rhythm.     Pulses: Normal pulses.     Heart sounds: Normal heart sounds. No murmur heard. Pulmonary:     Effort: Pulmonary effort is normal. No respiratory distress.     Breath sounds: Normal breath sounds. No wheezing.  Chest:     Chest wall: No mass.  Breasts:    Tanner Score is 5.     Right: Normal. No mass or tenderness.     Left: Normal. No mass or tenderness.  Abdominal:     General: Abdomen is flat. Bowel sounds are normal. There is no distension.     Palpations: Abdomen is soft.     Tenderness: There is no abdominal tenderness.  Genitourinary:    Rectum: Guaiac result negative.  Musculoskeletal:        General: No swelling. Normal range of motion.     Cervical back: Full passive range of motion without pain, normal range of motion and neck supple.     Right lower leg: No edema.     Left lower leg: No edema.  Lymphadenopathy:     Upper Body:     Right upper body: No supraclavicular, axillary or pectoral adenopathy.     Left upper body: No supraclavicular, axillary or pectoral adenopathy.  Skin:    General: Skin is warm and dry.     Capillary Refill: Capillary refill takes less than 2 seconds.  Neurological:      General: No focal deficit present.     Mental Status: She is alert and oriented to person, place, and time.     Cranial Nerves: No cranial nerve deficit.     Sensory: No sensory deficit.  Psychiatric:        Mood and Affect: Mood normal.        Behavior: Behavior normal.        Thought Content: Thought content normal.        Judgment: Judgment normal.      Diabetic foot  exam was performed with the following findings:   No deformities, ulcerations, or other skin breakdown Normal sensation of 10g monofilament Intact posterior tibialis and dorsalis pedis pulses      Assessment And Plan:     Encounter for annual health examination Assessment & Plan: Behavior modifications discussed and diet history reviewed.   Pt will continue to exercise regularly and modify diet with low GI, plant based foods and decrease intake of processed foods.  Recommend intake of daily multivitamin, Vitamin D, and calcium.  Recommend mammogram and colonoscopy for preventive screenings, as well as recommend immunizations that include influenza, TDAP, and Shingles    Essential hypertension Assessment & Plan: Blood pressure is well controlled, continue current medications.   Orders: -     EKG 12-Lead -     POCT URINALYSIS DIP (CLINITEK) -     Microalbumin / creatinine urine ratio -     CMP14+EGFR  Type 2 diabetes mellitus with obesity (HCC) Assessment & Plan: HgbA1c increased to 6.6, now classified as having diabetes. Will try to get her approved for Ozempic, she has taken this in the past. Denies family history of medullary thyroid cancer or personal history of pancreatitis.  Continue focusing on healthy diet and regular exercise  Orders: -     Hemoglobin A1c -     Ozempic (0.25 or 0.5 MG/DOSE); Inject 0.5 mg into the skin once a week.  Dispense: 4.5 mL; Refill: 1  Elevated cholesterol Assessment & Plan: Goal LDL less than 70, continue current medications. Continue low fat diet.   Orders: -      Lipid panel  COVID-19 vaccination declined Assessment & Plan: Declines covid 19 vaccine. Discussed risk of covid 84 and if she changes her mind about the vaccine to call the office. Education has been provided regarding the importance of this vaccine but patient still declined. Advised may receive this vaccine at local pharmacy or Health Dept.or vaccine clinic. Aware to provide a copy of the vaccination record if obtained from local pharmacy or Health Dept.  Encouraged to take multivitamin, vitamin d, vitamin c and zinc to increase immune system. Aware can call office if would like to have vaccine here at office. Verbalized acceptance and understanding.     Herpes zoster vaccination declined Assessment & Plan: Declines shingrix, educated on disease process and is aware if he changes his mind to notify office    BMI 39.0-39.9,adult Assessment & Plan: She is encouraged to strive for BMI less than 30 to decrease cardiac risk. Advised to aim for at least 150 minutes of exercise per week.    Other long term (current) drug therapy -     CBC with Differential/Platelet  Type 2 diabetes mellitus associated with morbid obesity (HCC) Assessment & Plan: HgbA1c increased to 6.6, now classified as having diabetes. Will try to get her approved for Ozempic, she has taken this in the past. Denies family history of medullary thyroid cancer or personal history of pancreatitis.  Continue focusing on healthy diet and regular exercise     Return for 1 year physical, 6 month bp check. Patient was given opportunity to ask questions. Patient verbalized understanding of the plan and was able to repeat key elements of the plan. All questions were answered to their satisfaction.   Misty Epley, FNP  I, Misty Epley, FNP, have reviewed all documentation for this visit. The documentation on 10/13/23 for the exam, diagnosis, procedures, and orders are all accurate and complete.

## 2023-10-13 NOTE — Assessment & Plan Note (Signed)
 She is encouraged to strive for BMI less than 30 to decrease cardiac risk. Advised to aim for at least 150 minutes of exercise per week.

## 2023-10-13 NOTE — Assessment & Plan Note (Signed)
 Declines shingrix, educated on disease process and is aware if he changes his mind to notify office

## 2023-10-13 NOTE — Assessment & Plan Note (Signed)
 Blood pressure is well controlled, continue current medications.

## 2023-10-13 NOTE — Assessment & Plan Note (Signed)

## 2023-10-13 NOTE — Assessment & Plan Note (Signed)
 Goal LDL less than 70, continue current medications. Continue low fat diet.

## 2023-10-13 NOTE — Assessment & Plan Note (Signed)
 HgbA1c increased to 6.6, now classified as having diabetes. Will try to get her approved for Ozempic, she has taken this in the past. Denies family history of medullary thyroid cancer or personal history of pancreatitis.  Continue focusing on healthy diet and regular exercise

## 2023-10-14 ENCOUNTER — Encounter: Payer: Self-pay | Admitting: Nurse Practitioner

## 2023-10-14 LAB — CBC WITH DIFFERENTIAL/PLATELET
Basophils Absolute: 0.1 10*3/uL (ref 0.0–0.2)
Basos: 1 %
EOS (ABSOLUTE): 0 10*3/uL (ref 0.0–0.4)
Eos: 1 %
Hematocrit: 38.6 % (ref 34.0–46.6)
Hemoglobin: 12.7 g/dL (ref 11.1–15.9)
Immature Grans (Abs): 0 10*3/uL (ref 0.0–0.1)
Immature Granulocytes: 0 %
Lymphocytes Absolute: 2.5 10*3/uL (ref 0.7–3.1)
Lymphs: 50 %
MCH: 28.9 pg (ref 26.6–33.0)
MCHC: 32.9 g/dL (ref 31.5–35.7)
MCV: 88 fL (ref 79–97)
Monocytes Absolute: 0.4 10*3/uL (ref 0.1–0.9)
Monocytes: 8 %
Neutrophils Absolute: 1.9 10*3/uL (ref 1.4–7.0)
Neutrophils: 40 %
Platelets: 316 10*3/uL (ref 150–450)
RBC: 4.39 x10E6/uL (ref 3.77–5.28)
RDW: 12.7 % (ref 11.7–15.4)
WBC: 4.9 10*3/uL (ref 3.4–10.8)

## 2023-10-14 LAB — CMP14+EGFR
ALT: 13 IU/L (ref 0–32)
AST: 19 IU/L (ref 0–40)
Albumin: 4.1 g/dL (ref 3.8–4.9)
Alkaline Phosphatase: 86 IU/L (ref 44–121)
BUN/Creatinine Ratio: 12 (ref 9–23)
BUN: 11 mg/dL (ref 6–24)
Bilirubin Total: 0.4 mg/dL (ref 0.0–1.2)
CO2: 24 mmol/L (ref 20–29)
Calcium: 9.6 mg/dL (ref 8.7–10.2)
Chloride: 103 mmol/L (ref 96–106)
Creatinine, Ser: 0.95 mg/dL (ref 0.57–1.00)
Globulin, Total: 3 g/dL (ref 1.5–4.5)
Glucose: 97 mg/dL (ref 70–99)
Potassium: 4 mmol/L (ref 3.5–5.2)
Sodium: 142 mmol/L (ref 134–144)
Total Protein: 7.1 g/dL (ref 6.0–8.5)
eGFR: 69 mL/min/{1.73_m2} (ref >59)

## 2023-10-14 LAB — HEMOGLOBIN A1C
Est. average glucose Bld gHb Est-mCnc: 126 mg/dL
Hgb A1c MFr Bld: 6 % — ABNORMAL HIGH (ref 4.8–5.6)

## 2023-10-14 LAB — MICROALBUMIN / CREATININE URINE RATIO
Creatinine, Urine: 154.6 mg/dL
Microalb/Creat Ratio: 3 mg/g{creat} (ref 0–29)
Microalbumin, Urine: 4.4 ug/mL

## 2023-10-14 LAB — LIPID PANEL
Chol/HDL Ratio: 3.6 ratio (ref 0.0–4.4)
Cholesterol, Total: 182 mg/dL (ref 100–199)
HDL: 51 mg/dL (ref >39)
LDL Chol Calc (NIH): 114 mg/dL — ABNORMAL HIGH (ref 0–99)
Triglycerides: 94 mg/dL (ref 0–149)
VLDL Cholesterol Cal: 17 mg/dL (ref 5–40)

## 2023-10-15 ENCOUNTER — Other Ambulatory Visit: Payer: Self-pay | Admitting: Nurse Practitioner

## 2023-10-15 LAB — URINE CULTURE

## 2023-10-15 MED ORDER — AMOXICILLIN 875 MG PO TABS: 875.0000 mg | ORAL_TABLET | Freq: Two times a day (BID) | ORAL | 0 refills | Status: DC

## 2023-10-20 DIAGNOSIS — R82998 Other abnormal findings in urine: Secondary | ICD-10-CM | POA: Insufficient documentation

## 2023-10-20 NOTE — Assessment & Plan Note (Signed)
 Trace white cells, will send for urine culture

## 2023-11-18 ENCOUNTER — Telehealth: Payer: Self-pay

## 2023-11-18 NOTE — Telephone Encounter (Signed)
 Called patient to see if she could come 11/25/2023 for the mobile mammogram bus.

## 2023-11-24 ENCOUNTER — Encounter: Payer: Self-pay | Admitting: Nurse Practitioner

## 2023-12-28 ENCOUNTER — Other Ambulatory Visit: Payer: Self-pay | Admitting: Nurse Practitioner

## 2023-12-28 DIAGNOSIS — I10 Essential (primary) hypertension: Secondary | ICD-10-CM

## 2024-01-21 ENCOUNTER — Encounter: Payer: Self-pay | Admitting: Pharmacist

## 2024-01-21 NOTE — Progress Notes (Signed)
   01/21/2024  Patient ID: Misty [name], female   DOB: [birth year], 58 y.o.   MRN: [id number]  Patient has a diagnosis of diabetes but is not on statin therapy. As such, she is on the [facility] Statin report.    The 10-year ASCVD risk score (Arnett DK, et al., 2019) is: 14%   Values used to calculate the score:     Age: 36 years     Clincally relevant sex: Female     Is Non-Hispanic African American: Yes     Diabetic: Yes     Tobacco smoker: No     Systolic Blood Pressure: 130 mmHg     Is BP treated: Yes     HDL Cholesterol: 51 mg/dL     Total Cholesterol: 182 mg/dL  With an ASCVD >2.4% statin therapy may be a serious consideration.  She did not have an documentation of statin intolerance.  She has an upcoming appointment on 02/17/24    Plan: Send a delayed note to the PCP prior to the upcoming appointment.   Cassius DOROTHA Brought, PharmD, BCACP Clinical Pharmacist [phone]

## 2024-01-27 ENCOUNTER — Other Ambulatory Visit: Payer: Self-pay | Admitting: Nurse Practitioner

## 2024-01-27 DIAGNOSIS — I10 Essential (primary) hypertension: Secondary | ICD-10-CM

## 2024-02-16 NOTE — Progress Notes (Signed)
 LILLETTE Kristeen JINNY Gladis, CMA,acting as a Neurosurgeon for Misty Ada, FNP.,have documented all relevant documentation on the behalf of Misty Ada, FNP,as directed by  Misty Ada, FNP while in the presence of Misty Ada, FNP.  Subjective:  Patient ID: MARCILLE BARMAN , female    DOB: [birth year] , 58 y.o.   MRN: [id number]  Chief Complaint  Patient presents with   Hypertension    Patient presents today for a bp and dm follow up, Patient reports compliance with medication. Patient denies any chest pain, SOB, or headaches. Patient reports that her ozempic  makes her nause on her dose day.     HPI  HPI  Discussed the use of AI scribe software for clinical note transcription with the patient, who gave verbal consent to proceed.  History of Present Illness COLIN NORMENT is a 58 year old female with type 2 diabetes who presents for a follow-up visit.  She has experienced a weight loss of 17 pounds over the past few months, which she attributes to her current diabetes medication regimen. She is currently on a dose of 0.5 mg of Ozempic . She has experienced nausea and vomiting twice, associated with consuming fatty or sweet foods.  Her medical history includes type 2 diabetes, managed with Ozempic , and hypertension, managed with valsartan  and amlodipine . She has not yet had her annual eye exam, mammogram, or Pap smear. She has a history of toe surgery for bone spur removal, and her toenail is currently regrowing.  She engages in regular physical activity, including biking, weight lifting, and walking in her neighborhood. She is mindful of her diet, especially since she cannot eat much due to her medication.  Her family history includes her mother, who is 75 years old and in good health. She does not have a family history of bunions, although she suspects she is developing one.   Past Medical History:  Diagnosis Date   Hypertension    Morbid obesity due to excess calories (HCC)     Osteoporosis      Family History  Problem Relation Age of Onset   Osteoporosis Mother    Breast cancer Sister      Current Outpatient Medications:    amoxicillin  (AMOXIL ) 875 MG tablet, Take 1 tablet (875 mg total) by mouth 2 (two) times daily., Disp: 14 tablet, Rfl: 0   atorvastatin  (LIPITOR) 10 MG tablet, Take 1 tab by mouth MWF, Disp: 45 tablet, Rfl: 1   cetirizine (ZYRTEC) 10 MG tablet, Take 10 mg by mouth daily., Disp: , Rfl:    Magnesium Hydroxide (MAGNESIA PO), Take 1 tablet by mouth daily., Disp: , Rfl:    Multiple Vitamins-Minerals (CENTRUM SILVER 50+WOMEN PO), Take by mouth., Disp: , Rfl:    Semaglutide , 1 MG/DOSE, 4 MG/3ML SOPN, Inject 1 mg into the skin once a week., Disp: 9 mL, Rfl: 1   Vitamin D , Ergocalciferol , (DRISDOL ) 1.25 MG (50000 UNIT) CAPS capsule, One capsule po twice weekly on Tuesdays/Fridays, Disp: 24 capsule, Rfl: 1   amLODipine  (NORVASC ) 5 MG tablet, Take 1 tablet (5 mg total) by mouth daily., Disp: 30 tablet, Rfl: 5   valsartan -hydrochlorothiazide  (DIOVAN -HCT) 160-25 MG tablet, Take 1 tablet by mouth daily., Disp: 30 tablet, Rfl: 5   No Known Allergies   Review of Systems  Constitutional: Negative.   Eyes: Negative.   Respiratory: Negative.    Cardiovascular: Negative.   Musculoskeletal: Negative.   Skin: Negative.   Psychiatric/Behavioral: Negative.       Today's Vitals  02/17/24 0829  BP: 110/70  Pulse: 61  Temp: 98.5 F (36.9 C)  TempSrc: Oral  Weight: 200 lb 9.6 oz (91 kg)  Height: 5' 2 (1.575 m)  PainSc: 0-No pain   Body mass index is 36.69 kg/m.  Wt Readings from Last 3 Encounters:  02/17/24 200 lb 9.6 oz (91 kg)  10/13/23 217 lb 12.8 oz (98.8 kg)  06/01/23 215 lb (97.5 kg)    The 10-year ASCVD risk score (Arnett DK, et al., 2019) is: 8.5%   Values used to calculate the score:     Age: 75 years     Clincally relevant sex: Female     Is Non-Hispanic African American: Yes     Diabetic: Yes     Tobacco smoker: No     Systolic  Blood Pressure: 110 mmHg     Is BP treated: Yes     HDL Cholesterol: 51 mg/dL     Total Cholesterol: 182 mg/dL  Objective:  Physical Exam Vitals and nursing note reviewed.  Constitutional:      General: She is not in acute distress.    Appearance: Normal appearance. She is well-developed. She is obese.  HENT:     Head: Normocephalic and atraumatic.  Eyes:     Pupils: Pupils are equal, round, and reactive to light.  Cardiovascular:     Rate and Rhythm: Normal rate and regular rhythm.     Pulses: Normal pulses.     Heart sounds: Normal heart sounds. No murmur heard. Pulmonary:     Effort: Pulmonary effort is normal. No respiratory distress.     Breath sounds: Normal breath sounds. No wheezing.  Skin:    General: Skin is warm and dry.     Capillary Refill: Capillary refill takes less than 2 seconds.  Neurological:     General: No focal deficit present.     Mental Status: She is alert and oriented to person, place, and time.     Cranial Nerves: No cranial nerve deficit.  Psychiatric:        Mood and Affect: Mood normal.        Behavior: Behavior normal.        Thought Content: Thought content normal.        Judgment: Judgment normal.      Diabetic foot exam was performed with the following findings:   No deformities, ulcerations, or other skin breakdown Normal sensation of 10g monofilament Intact posterior tibialis and dorsalis pedis pulses        Assessment And Plan:  Type 2 diabetes mellitus with obesity (HCC) Assessment & Plan: Type 2 diabetes mellitus with recent weight loss. Nausea and vomiting likely due to dietary intake. Blood pressure controlled. LDL 114, above target. ASCVD risk score 8.5%, above target. Discussed potential for reversing diabetes with Ozempic . - Increase semaglutide  (Ozempic ) to 1 mg weekly. - Order A1c test. - Prescribe atorvastatin  10 mg, Monday, Wednesday, Friday. - Educated on diet and exercise importance. - Encouraged regular foot  checks.  Orders: -     BMP8+eGFR -     Hemoglobin A1c -     Atorvastatin  Calcium ; Take 1 tab by mouth MWF  Dispense: 45 tablet; Refill: 1 -     Semaglutide  (1 MG/DOSE); Inject 1 mg into the skin once a week.  Dispense: 9 mL; Refill: 1  Essential hypertension Assessment & Plan: Essential hypertension well-controlled with valsartan -hydrochlorothiazide  and amlodipine . Blood pressure 110/70 mmHg. - Prescribe valsartan -hydrochlorothiazide  for 90 days. - Prescribe amlodipine  for  90 days.  Orders: -     Valsartan -hydroCHLOROthiazide ; Take 1 tablet by mouth daily.  Dispense: 30 tablet; Refill: 5 -     amLODIPine  Besylate; Take 1 tablet (5 mg total) by mouth daily.  Dispense: 30 tablet; Refill: 5  Class 2 obesity without serious comorbidity with body mass index (BMI) of 36.0 to 36.9 in adult, unspecified obesity type Assessment & Plan: Obesity with recent weight loss attributed to semaglutide  and dietary changes. - Continue semaglutide  (Ozempic ). - Encouraged regular physical activity.   Herpes zoster vaccination declined Assessment & Plan: Declines shingrix , educated on disease process and is aware if he changes his mind to notify office    COVID-19 vaccination declined Assessment & Plan: Declines covid 19 vaccine. Discussed risk of covid 102 and if she changes her mind about the vaccine to call the office. Education has been provided regarding the importance of this vaccine but patient still declined. Advised may receive this vaccine at local pharmacy or Health Dept.or vaccine clinic. Aware to provide a copy of the vaccination record if obtained from local pharmacy or Health Dept.  Encouraged to take multivitamin, vitamin d , vitamin c and zinc to increase immune system. Aware can call office if would like to have vaccine here at office. Verbalized acceptance and understanding.     Encounter for screening -     Hepatitis B surface antibody,qualitative  Elevated cholesterol Assessment  & Plan: Hyperlipidemia with LDL 114, above target. Discussed statin therapy importance for heart protection in diabetes. - Prescribe atorvastatin  10 mg, Monday, Wednesday, Friday.      Return for controlled DM check 4 months.  Patient was given opportunity to ask questions. Patient verbalized understanding of the plan and was able to repeat key elements of the plan. All questions were answered to their satisfaction.    LILLETTE Misty Ada, FNP, have reviewed all documentation for this visit. The documentation on 02/17/24 for the exam, diagnosis, procedures, and orders are all accurate and complete.   IF YOU HAVE BEEN REFERRED TO A SPECIALIST, IT MAY TAKE 1-2 WEEKS TO SCHEDULE/PROCESS THE REFERRAL. IF YOU HAVE NOT HEARD FROM US /SPECIALIST IN TWO WEEKS, PLEASE GIVE US  A CALL AT [phone].

## 2024-02-17 ENCOUNTER — Encounter: Payer: Self-pay | Admitting: Nurse Practitioner

## 2024-02-17 ENCOUNTER — Ambulatory Visit: Admitting: Nurse Practitioner

## 2024-02-17 VITALS — BP 110/70 | HR 61 | Temp 98.5°F | Ht 62.0 in | Wt 200.6 lb

## 2024-02-17 DIAGNOSIS — E669 Obesity, unspecified: Secondary | ICD-10-CM

## 2024-02-17 DIAGNOSIS — E7800 Pure hypercholesterolemia, unspecified: Secondary | ICD-10-CM

## 2024-02-17 DIAGNOSIS — E66812 Obesity, class 2: Secondary | ICD-10-CM | POA: Diagnosis not present

## 2024-02-17 DIAGNOSIS — Z139 Encounter for screening, unspecified: Secondary | ICD-10-CM

## 2024-02-17 DIAGNOSIS — I10 Essential (primary) hypertension: Secondary | ICD-10-CM

## 2024-02-17 DIAGNOSIS — E1169 Type 2 diabetes mellitus with other specified complication: Secondary | ICD-10-CM | POA: Insufficient documentation

## 2024-02-17 DIAGNOSIS — Z6836 Body mass index (BMI) 36.0-36.9, adult: Secondary | ICD-10-CM | POA: Insufficient documentation

## 2024-02-17 DIAGNOSIS — Z2821 Immunization not carried out because of patient refusal: Secondary | ICD-10-CM

## 2024-02-17 MED ORDER — SEMAGLUTIDE (1 MG/DOSE) 4 MG/3ML ~~LOC~~ SOPN: 1.0000 mg | PEN_INJECTOR | SUBCUTANEOUS | 1 refills | Status: DC

## 2024-02-17 MED ORDER — VALSARTAN-HYDROCHLOROTHIAZIDE 160-25 MG PO TABS: 1.0000 | ORAL_TABLET | Freq: Every day | ORAL | 5 refills | Status: DC

## 2024-02-17 MED ORDER — ATORVASTATIN CALCIUM 10 MG PO TABS: ORAL_TABLET | ORAL | 1 refills | Status: AC

## 2024-02-17 MED ORDER — AMLODIPINE BESYLATE 5 MG PO TABS: 5.0000 mg | ORAL_TABLET | Freq: Every day | ORAL | 5 refills | Status: DC

## 2024-02-17 NOTE — Assessment & Plan Note (Signed)

## 2024-02-17 NOTE — Assessment & Plan Note (Signed)
 Hyperlipidemia with LDL 114, above target. Discussed statin therapy importance for heart protection in diabetes. - Prescribe atorvastatin  10 mg, Monday, Wednesday, Friday.

## 2024-02-17 NOTE — Assessment & Plan Note (Addendum)
 Essential hypertension well-controlled with valsartan -hydrochlorothiazide  and amlodipine . Blood pressure 110/70 mmHg. - Prescribe valsartan -hydrochlorothiazide  for 90 days. - Prescribe amlodipine  for 90 days.

## 2024-02-17 NOTE — Assessment & Plan Note (Signed)
 Declines shingrix , educated on disease process and is aware if he changes his mind to notify office

## 2024-02-17 NOTE — Assessment & Plan Note (Signed)
 Obesity with recent weight loss attributed to semaglutide  and dietary changes. - Continue semaglutide  (Ozempic ). - Encouraged regular physical activity.

## 2024-02-17 NOTE — Assessment & Plan Note (Signed)
 Type 2 diabetes mellitus with recent weight loss. Nausea and vomiting likely due to dietary intake. Blood pressure controlled. LDL 114, above target. ASCVD risk score 8.5%, above target. Discussed potential for reversing diabetes with Ozempic . - Increase semaglutide  (Ozempic ) to 1 mg weekly. - Order A1c test. - Prescribe atorvastatin  10 mg, Monday, Wednesday, Friday. - Educated on diet and exercise importance. - Encouraged regular foot checks.

## 2024-02-18 LAB — BMP8+EGFR
BUN/Creatinine Ratio: 12 (ref 9–23)
BUN: 10 mg/dL (ref 6–24)
CO2: 25 mmol/L (ref 20–29)
Calcium: 9.6 mg/dL (ref 8.7–10.2)
Chloride: 100 mmol/L (ref 96–106)
Creatinine, Ser: 0.83 mg/dL (ref 0.57–1.00)
Glucose: 76 mg/dL (ref 70–99)
Potassium: 4 mmol/L (ref 3.5–5.2)
Sodium: 141 mmol/L (ref 134–144)
eGFR: 82 mL/min/1.73 (ref >59)

## 2024-02-18 LAB — HEPATITIS B SURFACE ANTIBODY,QUALITATIVE: Hep B Surface Ab, Qual: REACTIVE

## 2024-02-18 LAB — HEMOGLOBIN A1C
Est. average glucose Bld gHb Est-mCnc: 114 mg/dL
Hgb A1c MFr Bld: 5.6 % (ref 4.8–5.6)

## 2024-02-21 ENCOUNTER — Ambulatory Visit: Payer: Self-pay | Admitting: Nurse Practitioner

## 2024-05-03 ENCOUNTER — Telehealth: Payer: Self-pay

## 2024-05-03 ENCOUNTER — Other Ambulatory Visit: Payer: Self-pay | Admitting: Nurse Practitioner

## 2024-05-03 DIAGNOSIS — E669 Obesity, unspecified: Secondary | ICD-10-CM

## 2024-05-03 NOTE — Telephone Encounter (Signed)
 Copied from CRM [id number]. Topic: Clinical - Prescription Issue >> May 03, 2024 11:52 AM Hadassah PARAS wrote: Reason for CRM: Pt is wanting to know why OZEMPIC , 0.25 OR 0.5 MG/DOSE, 2 MG/3ML SOPN [Pharmacy Med Name: OZEMPIC  0.25-0.5 MG/DOSE PEN] has been denied.   Please advise #[id number]

## 2024-05-09 ENCOUNTER — Other Ambulatory Visit: Payer: Self-pay

## 2024-05-09 DIAGNOSIS — E119 Type 2 diabetes mellitus without complications: Secondary | ICD-10-CM

## 2024-05-09 MED ORDER — SEMAGLUTIDE (1 MG/DOSE) 4 MG/3ML ~~LOC~~ SOPN: 1.0000 mg | PEN_INJECTOR | SUBCUTANEOUS | 1 refills | Status: AC

## 2024-06-09 LAB — HM MAMMOGRAPHY

## 2024-06-14 LAB — HM PAP SMEAR

## 2024-06-23 ENCOUNTER — Encounter: Payer: Self-pay | Admitting: Nurse Practitioner

## 2024-06-23 ENCOUNTER — Ambulatory Visit: Payer: Self-pay | Admitting: Nurse Practitioner

## 2024-06-23 VITALS — BP 120/74 | HR 69 | Temp 97.5°F | Ht 62.0 in | Wt 196.8 lb

## 2024-06-23 DIAGNOSIS — I10 Essential (primary) hypertension: Secondary | ICD-10-CM | POA: Diagnosis not present

## 2024-06-23 DIAGNOSIS — E1169 Type 2 diabetes mellitus with other specified complication: Secondary | ICD-10-CM

## 2024-06-23 DIAGNOSIS — Z139 Encounter for screening, unspecified: Secondary | ICD-10-CM | POA: Diagnosis not present

## 2024-06-23 DIAGNOSIS — E559 Vitamin D deficiency, unspecified: Secondary | ICD-10-CM | POA: Diagnosis not present

## 2024-06-23 DIAGNOSIS — E7800 Pure hypercholesterolemia, unspecified: Secondary | ICD-10-CM | POA: Diagnosis not present

## 2024-06-23 DIAGNOSIS — Z6836 Body mass index (BMI) 36.0-36.9, adult: Secondary | ICD-10-CM

## 2024-06-23 DIAGNOSIS — E6609 Other obesity due to excess calories: Secondary | ICD-10-CM

## 2024-06-23 DIAGNOSIS — E669 Obesity, unspecified: Secondary | ICD-10-CM

## 2024-06-23 DIAGNOSIS — E66812 Obesity, class 2: Secondary | ICD-10-CM

## 2024-06-23 MED ORDER — VALSARTAN-HYDROCHLOROTHIAZIDE 160-25 MG PO TABS: 1.0000 | ORAL_TABLET | Freq: Every day | ORAL | 1 refills | Status: AC

## 2024-06-23 MED ORDER — AMLODIPINE BESYLATE 5 MG PO TABS: 5.0000 mg | ORAL_TABLET | Freq: Every day | ORAL | 1 refills | Status: AC

## 2024-06-23 NOTE — Assessment & Plan Note (Signed)
 Blood pressure management is ongoing with current medications. - Continue current antihypertensive regimen.

## 2024-06-23 NOTE — Assessment & Plan Note (Signed)
 She is encouraged to strive for BMI less than 30 to decrease cardiac risk. Advised to aim for at least 150 minutes of exercise per week. Weight management ongoing, recent weight 186 lbs. Discussed dietary habits and holiday impact. - Continue current weight management strategies.

## 2024-06-23 NOTE — Progress Notes (Signed)
 LILLETTE Kristeen JINNY Gladis, CMA,acting as a neurosurgeon for Gaines Ada, FNP.,have documented all relevant documentation on the behalf of Gaines Ada, FNP,as directed by  Gaines Ada, FNP while in the presence of Gaines Ada, FNP.  Subjective:  Patient ID: Misty [name] , female    DOB: 02/11/1966 , 58 y.o.   MRN: [id number]  Chief Complaint  Patient presents with   Hypertension    Patient presents today for a bp and dm follow up, Patient reports compliance with medication. Patient denies any chest pain, SOB, or headaches. Patient has no concerns today.     HPI  Discussed the use of AI scribe software for clinical note transcription with the patient, who gave verbal consent to proceed.  History of Present Illness Misty [name] is a 58 year old female with diabetes who presents for a follow-up visit regarding her cholesterol management.  She is hesitant to start prescribed medication for cholesterol management and has chosen to try a vitamin supplement containing red yeast rice and CoQ10 since November 11th. Her current cholesterol level is 114.  She has a history of diabetes, which increases her risk for cardiovascular events. She is on amlodipine  and valsartan /hydrochlorothiazide  for hypertension. She recently faced issues with her pharmacy regarding the Ozempic  prescription, receiving a lower dose than prescribed and unable to return it due to refrigeration requirements. She is on Ozempic  1 mg weekly.   She has experienced a slowdown in her weight loss, which she wants to make sure is normal and acceptable. Her current weight is 196 pounds, differing from her home scale reading of 186 pounds. She attributes some fluctuation to recent holiday eating, including sweets and party foods. today she has on her work clothes with boots and handcuffs.  She takes Centrum Silver for women and an additional vitamin D  supplement due to previously low levels. She has noticed a change in her nail growth  after switching to [facility] brand vitamins.  She has upcoming appointments with her eye doctor in January and recently completed a mammogram and Pap smear on December 4th with Dr. Ovid All at [facility].  Past Medical History:  Diagnosis Date   Hypertension    Morbid obesity due to excess calories (HCC)    Osteoporosis      Family History  Problem Relation Age of Onset   Osteoporosis Mother    Breast cancer Sister      Current Outpatient Medications:    atorvastatin  (LIPITOR) 10 MG tablet, Take 1 tab by mouth MWF, Disp: 45 tablet, Rfl: 1   cetirizine (ZYRTEC) 10 MG tablet, Take 10 mg by mouth daily., Disp: , Rfl:    Magnesium Hydroxide (MAGNESIA PO), Take 1 tablet by mouth daily., Disp: , Rfl:    Multiple Vitamins-Minerals (CENTRUM SILVER 50+WOMEN PO), Take by mouth., Disp: , Rfl:    Semaglutide , 1 MG/DOSE, 4 MG/3ML SOPN, Inject 1 mg into the skin once a week., Disp: 9 mL, Rfl: 1   Vitamin D , Ergocalciferol , (DRISDOL ) 1.25 MG (50000 UNIT) CAPS capsule, One capsule po twice weekly on Tuesdays/Fridays, Disp: 24 capsule, Rfl: 1   amLODipine  (NORVASC ) 5 MG tablet, Take 1 tablet (5 mg total) by mouth daily., Disp: 90 tablet, Rfl: 1   valsartan -hydrochlorothiazide  (DIOVAN -HCT) 160-25 MG tablet, Take 1 tablet by mouth daily., Disp: 90 tablet, Rfl: 1   No Known Allergies   Review of Systems  Constitutional: Negative.   Eyes: Negative.   Respiratory: Negative.    Cardiovascular: Negative.   Musculoskeletal:  Negative.   Skin: Negative.   Psychiatric/Behavioral: Negative.       Today's Vitals   06/23/24 0829  BP: 120/74  Pulse: 69  Temp: (!) 97.5 F (36.4 C)  TempSrc: Oral  Weight: 196 lb 12.8 oz (89.3 kg)  Height: 5' 2 (1.575 m)  PainSc: 0-No pain   Body mass index is 36 kg/m.  Wt Readings from Last 3 Encounters:  06/23/24 196 lb 12.8 oz (89.3 kg)  02/17/24 200 lb 9.6 oz (91 kg)  10/13/23 217 lb 12.8 oz (98.8 kg)     Objective:  Physical Exam Vitals  and nursing note reviewed.  Constitutional:      General: She is not in acute distress.    Appearance: Normal appearance. She is well-developed. She is obese.  HENT:     Head: Normocephalic and atraumatic.  Eyes:     Pupils: Pupils are equal, round, and reactive to light.  Cardiovascular:     Rate and Rhythm: Normal rate and regular rhythm.     Pulses: Normal pulses.     Heart sounds: Normal heart sounds. No murmur heard. Pulmonary:     Effort: Pulmonary effort is normal. No respiratory distress.     Breath sounds: Normal breath sounds. No wheezing.  Skin:    General: Skin is warm and dry.     Capillary Refill: Capillary refill takes less than 2 seconds.  Neurological:     General: No focal deficit present.     Mental Status: She is alert and oriented to person, place, and time.     Cranial Nerves: No cranial nerve deficit.  Psychiatric:        Mood and Affect: Mood normal.        Behavior: Behavior normal.        Thought Content: Thought content normal.        Judgment: Judgment normal.      Assessment And Plan:   Assessment & Plan Essential hypertension  Type 2 diabetes mellitus in patient with obesity (HCC) A1c was normal at last visit, however she feels she has been eating more sweets over the last month. Discussed increased intake of sweets and pies. - Ordered hemoglobin A1c to assess current glycemic control. - Ensure follow-up with eye doctor in January. Elevated cholesterol Elevated LDL and ASCVD risk of 11.8%. Discussed statins and red yeast rice. Emphasized LDL target <70 due to diabetes and cardiovascular risk. - Ordered lipid panel to reassess cholesterol levels. - Ordered lipoprotein A to evaluate genetic predisposition. - Will consider referral to lipid clinic if cholesterol management remains challenging. Class 2 obesity without serious comorbidity with body mass index (BMI) of 36.0 to 36.9 in adult, unspecified obesity type She is encouraged to strive for  BMI less than 30 to decrease cardiac risk. Advised to aim for at least 150 minutes of exercise per week. Weight management ongoing, recent weight 186 lbs. Discussed dietary habits and holiday impact. - Continue current weight management strategies. Encounter for screening  Vitamin D  deficiency Vitamin D  supplementation ongoing. Reports change in nail growth after switching brands. - Continue current vitamin D  supplementation regimen. Essential hypertension Blood pressure management is ongoing with current medications. - Continue current antihypertensive regimen.  Orders Placed This Encounter  Procedures   BMP8+eGFR   Lipid panel   Hemoglobin A1c   Lipoprotein A (LPA)   Vitamin D  (25 hydroxy)    Return for keep same next.  Patient was given opportunity to ask questions. Patient verbalized understanding of  the plan and was able to repeat key elements of the plan. All questions were answered to their satisfaction.    LILLETTE Gaines Ada, FNP, have reviewed all documentation for this visit. The documentation on 06/23/2024 for the exam, diagnosis, procedures, and orders are all accurate and complete.   IF YOU HAVE BEEN REFERRED TO A SPECIALIST, IT MAY TAKE 1-2 WEEKS TO SCHEDULE/PROCESS THE REFERRAL. IF YOU HAVE NOT HEARD FROM US /SPECIALIST IN TWO WEEKS, PLEASE GIVE US  A CALL AT [phone].

## 2024-06-23 NOTE — Assessment & Plan Note (Signed)
 Elevated LDL and ASCVD risk of 11.8%. Discussed statins and red yeast rice. Emphasized LDL target <70 due to diabetes and cardiovascular risk. - Ordered lipid panel to reassess cholesterol levels. - Ordered lipoprotein A to evaluate genetic predisposition. - Will consider referral to lipid clinic if cholesterol management remains challenging.

## 2024-06-24 LAB — LIPID PANEL
Chol/HDL Ratio: 3.2 ratio (ref 0.0–4.4)
Cholesterol, Total: 166 mg/dL (ref 100–199)
HDL: 52 mg/dL
LDL Chol Calc (NIH): 101 mg/dL — ABNORMAL HIGH (ref 0–99)
Triglycerides: 64 mg/dL (ref 0–149)
VLDL Cholesterol Cal: 13 mg/dL (ref 5–40)

## 2024-06-24 LAB — BMP8+EGFR
BUN/Creatinine Ratio: 17 (ref 9–23)
BUN: 18 mg/dL (ref 6–24)
CO2: 26 mmol/L (ref 20–29)
Calcium: 9.7 mg/dL (ref 8.7–10.2)
Chloride: 100 mmol/L (ref 96–106)
Creatinine, Ser: 1.04 mg/dL — ABNORMAL HIGH (ref 0.57–1.00)
Glucose: 83 mg/dL (ref 70–99)
Potassium: 3.9 mmol/L (ref 3.5–5.2)
Sodium: 141 mmol/L (ref 134–144)
eGFR: 62 mL/min/1.73

## 2024-06-24 LAB — HEMOGLOBIN A1C
Est. average glucose Bld gHb Est-mCnc: 114 mg/dL
Hgb A1c MFr Bld: 5.6 % (ref 4.8–5.6)

## 2024-06-24 LAB — LIPOPROTEIN A (LPA): Lipoprotein (a): 214.1 nmol/L — ABNORMAL HIGH

## 2024-06-24 LAB — VITAMIN D 25 HYDROXY (VIT D DEFICIENCY, FRACTURES): Vit D, 25-Hydroxy: 46.1 ng/mL (ref 30.0–100.0)

## 2024-07-11 ENCOUNTER — Ambulatory Visit
Admission: RE | Admit: 2024-07-11 | Discharge: 2024-07-11 | Disposition: A | Discharge status: Home / Self Care | Source: Ambulatory Visit | Attending: Family Medicine | Admitting: Family Medicine

## 2024-07-11 VITALS — BP 146/88 | HR 76 | Temp 98.1°F | Resp 15 | Ht 62.0 in | Wt 156.0 lb

## 2024-07-11 DIAGNOSIS — J111 Influenza due to unidentified influenza virus with other respiratory manifestations: Secondary | ICD-10-CM

## 2024-07-11 NOTE — ED Provider Notes (Signed)
 " [facility]    CSN: [id number] Arrival date & time: 07/11/24  1056      History   Chief Complaint Chief Complaint  Patient presents with   Influenza    Entered by patient   Fever    HPI Misty [name] is a 59 y.o. female  presents for evaluation of URI symptoms for 4 days. Patient reports associated symptoms of cough, congestion, headache, chills and fatigue. Denies N/V/D, sore throat, fevers, ear pain, body aches or shortness of breath. Patient does note that have a hx of asthma. Patient is not an active smoker.   Reports son has the flu.  Pt has taken Mucinex OTC for symptoms.  Reports a negative home COVID and flu test on day 1 of symptoms.  Pt has no other concerns at this time.    Influenza Presenting symptoms: cough, fatigue and headache   Associated symptoms: chills and nasal congestion   Fever Associated symptoms: chills, congestion, cough and headaches     Past Medical History:  Diagnosis Date   Hypertension    Morbid obesity due to excess calories (HCC)    Osteoporosis     Patient Active Problem List   Diagnosis Date Noted   Type 2 diabetes mellitus with obesity 02/17/2024   Class 2 obesity without serious comorbidity with body mass index (BMI) of 36.0 to 36.9 in adult 02/17/2024   Leukocytes in urine 10/20/2023   Encounter for annual health examination 10/13/2023   BMI 39.0-39.9,adult 10/13/2023   Type 2 diabetes mellitus associated with morbid obesity (HCC) 06/03/2023   Elevated cholesterol 06/03/2023   COVID-19 vaccination declined 06/03/2023   Herpes zoster vaccination declined 06/03/2023   Influenza vaccination declined 06/03/2023   History of anemia 09/24/2020   Screening for malignant neoplasm of colon 09/24/2020   Essential hypertension 07/12/2012    Past Surgical History:  Procedure Laterality Date   CESAREAN SECTION     OOPHORECTOMY     r great toe surgery Right 11/06/2020    OB History     Gravida  2   Para  1    Term      Preterm      AB      Living  1      SAB      IAB      Ectopic      Multiple      Live Births               Home Medications    Prior to Admission medications  Medication Sig Start Date End Date Taking? Authorizing Provider  amLODipine  (NORVASC ) 5 MG tablet Take 1 tablet (5 mg total) by mouth daily. 06/23/24 06/23/25  Georgina Speaks, FNP  atorvastatin  (LIPITOR) 10 MG tablet Take 1 tab by mouth MWF Patient not taking: Reported on 07/11/2024 02/17/24   Georgina Speaks, FNP  cetirizine (ZYRTEC) 10 MG tablet Take 10 mg by mouth daily.    [provider]  Magnesium Hydroxide (MAGNESIA PO) Take 1 tablet by mouth daily.    [provider]  Multiple Vitamins-Minerals (CENTRUM SILVER 50+WOMEN PO) Take by mouth.    [provider]  Semaglutide , 1 MG/DOSE, 4 MG/3ML SOPN Inject 1 mg into the skin once a week. 05/09/24   Moore, Janece, FNP  valsartan -hydrochlorothiazide  (DIOVAN -HCT) 160-25 MG tablet Take 1 tablet by mouth daily. 06/23/24   Georgina Speaks, FNP  Vitamin D , Ergocalciferol , (DRISDOL ) 1.25 MG (50000 UNIT) CAPS capsule One capsule po twice  weekly on Tuesdays/Fridays 02/24/20   Jarold Medici, MD    Family History Family History  Problem Relation Age of Onset   Osteoporosis Mother    Breast cancer Sister     Social History Social History[1]   Allergies   Patient has no known allergies.   Review of Systems Review of Systems  Constitutional:  Positive for chills and fatigue.  HENT:  Positive for congestion.   Respiratory:  Positive for cough.   Neurological:  Positive for headaches.     Physical Exam Triage Vital Signs ED Triage Vitals  Encounter Vitals Group     BP 07/11/24 1118 (!) 146/88     Girls Systolic BP Percentile --      Girls Diastolic BP Percentile --      Boys Systolic BP Percentile --      Boys Diastolic BP Percentile --      Pulse Rate 07/11/24 1118 76     Resp 07/11/24 1118 15     Temp 07/11/24 1118 98.1  F (36.7 C)     Temp Source 07/11/24 1118 Oral     SpO2 07/11/24 1118 98 %     Weight 07/11/24 1121 156 lb (70.8 kg)     Height 07/11/24 1121 5' 2 (1.575 m)     Head Circumference --      Peak Flow --      Pain Score 07/11/24 1121 0     Pain Loc --      Pain Education --      Exclude from Growth Chart --    No data found.  Updated Vital Signs BP (!) 146/88 (BP Location: Right Arm)   Pulse 76   Temp 98.1 F (36.7 C) (Oral)   Resp 15   Ht 5' 2 (1.575 m)   Wt 156 lb (70.8 kg)   SpO2 98%   BMI 28.53 kg/m   Visual Acuity Right Eye Distance:   Left Eye Distance:   Bilateral Distance:    Right Eye Near:   Left Eye Near:    Bilateral Near:     Physical Exam Vitals and nursing note reviewed.  Constitutional:      General: She is not in acute distress.    Appearance: She is well-developed. She is not ill-appearing.  HENT:     Head: Normocephalic and atraumatic.     Right Ear: Tympanic membrane and ear canal normal.     Left Ear: Tympanic membrane and ear canal normal.     Nose: Congestion present.     Mouth/Throat:     Mouth: Mucous membranes are moist.     Pharynx: Oropharynx is clear. Uvula midline. No oropharyngeal exudate or posterior oropharyngeal erythema.     Tonsils: No tonsillar exudate or tonsillar abscesses.  Eyes:     Conjunctiva/sclera: Conjunctivae normal.     Pupils: Pupils are equal, round, and reactive to light.  Cardiovascular:     Rate and Rhythm: Normal rate and regular rhythm.     Heart sounds: Normal heart sounds.  Pulmonary:     Effort: Pulmonary effort is normal.     Breath sounds: Normal breath sounds. No wheezing, rhonchi or rales.  Musculoskeletal:     Cervical back: Normal range of motion and neck supple.  Lymphadenopathy:     Cervical: No cervical adenopathy.  Skin:    General: Skin is warm and dry.  Neurological:     General: No focal deficit present.     Mental Status:  She is alert and oriented to person, place, and time.   Psychiatric:        Mood and Affect: Mood normal.        Behavior: Behavior normal.      UC Treatments / Results  Labs (all labs ordered are listed, but only abnormal results are displayed) Labs Reviewed - No data to display  EKG   Radiology No results found.  Procedures Procedures (including critical care time)  Medications Ordered in UC Medications - No data to display  Initial Impression / Assessment and Plan / UC Course  I have reviewed the triage vital signs and the nursing notes.  Pertinent labs & imaging results that were available during my care of the patient were reviewed by me and considered in my medical decision making (see chart for details).     Reviewed exam and symptoms with patient.  No red flags.  Exam is reassuring.  Advised she is outside the window for influenza treatment/testing.  Discussed viral illness and the continued symptomatic treatment.  PCP follow-up if symptoms do not improve.  ER precautions reviewed. Final Clinical Impressions(s) / UC Diagnoses   Final diagnoses:  Influenza-like illness     Discharge Instructions      Please treat your symptoms with over the counter cough medication, tylenol  or ibuprofen, humidifier, and rest. Viral illnesses can last 7-10 days. Please follow up with your PCP if your symptoms are not improving. Please go to the ER for any worsening symptoms. This includes but is not limited to fever you can not control with tylenol  or ibuprofen, you are not able to stay hydrated, you have shortness of breath or chest pain.  Thank you for choosing [facility] for your healthcare needs. I hope you feel better soon!     ED Prescriptions   None    PDMP not reviewed this encounter.    [1]  Social History Tobacco Use   Smoking status: Never   Smokeless tobacco: Never  Vaping Use   Vaping status: Never Used  Substance Use Topics   Alcohol use: No   Drug use: No     Loreda Myla SAUNDERS, NP 07/11/24 1149  "

## 2024-07-11 NOTE — ED Triage Notes (Signed)
 PT st's she has had fever, chills, headache and fatigue x's 4 days.

## 2024-07-11 NOTE — Discharge Instructions (Signed)
 Please treat your symptoms with over the counter cough medication, tylenol or ibuprofen , humidifier, and rest. Viral illnesses can last 7-10 days. Please follow up with your PCP if your symptoms are not improving. Please go to the ER for any worsening symptoms. This includes but is not limited to fever you can not control with tylenol or ibuprofen , you are not able to stay hydrated, you have shortness of breath or chest pain.  Thank you for choosing [facility] for your healthcare needs. I hope you feel better soon!

## 2024-10-13 ENCOUNTER — Encounter: Payer: Self-pay | Admitting: Nurse Practitioner
